# Patient Record
Sex: Female | Born: 1975 | Race: Black or African American | Hispanic: No | Marital: Single | Smoking: Former smoker
Health system: Southern US, Community
[De-identification: ages and names within clinical notes are randomized; demographics above are authoritative.]

## PROBLEM LIST (undated history)

## (undated) DIAGNOSIS — J302 Other seasonal allergic rhinitis: Secondary | ICD-10-CM

## (undated) DIAGNOSIS — L309 Dermatitis, unspecified: Secondary | ICD-10-CM

## (undated) DIAGNOSIS — E669 Obesity, unspecified: Secondary | ICD-10-CM

## (undated) DIAGNOSIS — D649 Anemia, unspecified: Secondary | ICD-10-CM

## (undated) DIAGNOSIS — J45909 Unspecified asthma, uncomplicated: Secondary | ICD-10-CM

## (undated) HISTORY — PX: NO PAST SURGERIES: SHX2092

## (undated) HISTORY — DX: Other seasonal allergic rhinitis: J30.2

## (undated) HISTORY — DX: Obesity, unspecified: E66.9

---

## 2018-03-30 ENCOUNTER — Emergency Department (HOSPITAL_BASED_OUTPATIENT_CLINIC_OR_DEPARTMENT_OTHER): Payer: Self-pay

## 2018-03-30 ENCOUNTER — Other Ambulatory Visit: Payer: Self-pay

## 2018-03-30 ENCOUNTER — Encounter (HOSPITAL_BASED_OUTPATIENT_CLINIC_OR_DEPARTMENT_OTHER): Payer: Self-pay | Admitting: Emergency Medicine

## 2018-03-30 ENCOUNTER — Emergency Department (HOSPITAL_BASED_OUTPATIENT_CLINIC_OR_DEPARTMENT_OTHER)
Admission: EM | Admit: 2018-03-30 | Discharge: 2018-03-31 | Disposition: A | Discharge status: Home / Self Care | Payer: Self-pay | Attending: Emergency Medicine | Admitting: Emergency Medicine

## 2018-03-30 DIAGNOSIS — Z79899 Other long term (current) drug therapy: Secondary | ICD-10-CM | POA: Insufficient documentation

## 2018-03-30 DIAGNOSIS — J9801 Acute bronchospasm: Secondary | ICD-10-CM | POA: Insufficient documentation

## 2018-03-30 HISTORY — DX: Unspecified asthma, uncomplicated: J45.909

## 2018-03-30 MED ORDER — ALBUTEROL SULFATE (2.5 MG/3ML) 0.083% IN NEBU
2.5000 mg | INHALATION_SOLUTION | RESPIRATORY_TRACT | Status: DC | PRN
  Administered 2018-03-31: 2.5 mg via RESPIRATORY_TRACT
  Filled 2018-03-30: qty 3

## 2018-03-30 NOTE — ED Triage Notes (Signed)
Pt c/o SOB since yesterday morning, hx of asthma pt out of her inhaler.

## 2018-03-31 MED ORDER — DEXAMETHASONE SODIUM PHOSPHATE 10 MG/ML IJ SOLN
10.0000 mg | Freq: Once | INTRAMUSCULAR | Status: AC
  Administered 2018-03-31: 10 mg via INTRAMUSCULAR
  Filled 2018-03-31: qty 1

## 2018-03-31 MED ORDER — ALBUTEROL SULFATE HFA 108 (90 BASE) MCG/ACT IN AERS
2.0000 | INHALATION_SPRAY | RESPIRATORY_TRACT | Status: DC | PRN
  Filled 2018-03-31: qty 6.7

## 2018-03-31 NOTE — ED Provider Notes (Signed)
[facility] Provider Note: Lowella Dell, MD, FACEP  CSN: [id number] MRN: [id number] ARRIVAL: 03/30/18 at 2336 ROOM: MH07/[facility]07   CHIEF COMPLAINT  Shortness of Breath   HISTORY OF PRESENT ILLNESS  03/31/18 1:17 AM Halaina Druck is a 43 y.o. female with a history of asthma.  She has had difficulty breathing since yesterday morning.  Symptoms are moderate.  She is out of her inhaler.  She was given a neb treatment here by respiratory therapy prior to my evaluation with significant improvement.  She denies fever or other symptoms of acute illness.   Past Medical History:  Diagnosis Date  . Asthma     History reviewed. No pertinent surgical history.  History reviewed. No pertinent family history.  Social History   Tobacco Use  . Smoking status: Never Smoker  . Smokeless tobacco: Never Used  Substance Use Topics  . Alcohol use: Never    Frequency: Never  . Drug use: Never    Prior to Admission medications   Medication Sig Start Date End Date Taking? Authorizing Provider  Albuterol Sulfate 108 (90 Base) MCG/ACT AEPB Inhale into the lungs.    [provider]    Allergies Claritin [loratadine] and Shellfish allergy   REVIEW OF SYSTEMS  Negative except as noted here or in the History of Present Illness.   PHYSICAL EXAMINATION  Initial Vital Signs Blood pressure 112/84, pulse 85, temperature 97.9 F (36.6 C), temperature source Oral, resp. rate 19, height 5\' 6"  (1.676 m), weight 87.1 kg, last menstrual period 02/22/2018, SpO2 100 %.  Examination General: Well-developed, well-nourished female in no acute distress; appearance consistent with age of record HENT: normocephalic; atraumatic Eyes: pupils equal, round and reactive to light; extraocular muscles intact Neck: supple Heart: regular rate and rhythm Lungs: clear to auscultation bilaterally Abdomen: soft; nondistended; nontender; bowel sounds present Extremities: No deformity; full range of  motion Neurologic: Awake, alert and oriented; motor function intact in all extremities and symmetric; no facial droop Skin: Warm and dry Psychiatric: Normal mood and affect   RESULTS  Summary of this visit's results, reviewed by myself:   EKG Interpretation  Date/Time:    Ventricular Rate:    PR Interval:    QRS Duration:   QT Interval:    QTC Calculation:   R Axis:     Text Interpretation:        Laboratory Studies: No results found for this or any previous visit (from the past 24 hour(s)). Imaging Studies: Dg Chest 2 View  Result Date: 03/31/2018 CLINICAL DATA:  Shortness of breath EXAM: CHEST - 2 VIEW COMPARISON:  None. FINDINGS: The heart size and mediastinal contours are within normal limits. Both lungs are clear. The visualized skeletal structures are unremarkable. IMPRESSION: Normal chest. Electronically Signed   By: Deatra Robinson M.D.   On: 03/31/2018 00:18    ED COURSE and MDM  Nursing notes and initial vitals signs, including pulse oximetry, reviewed.  Vitals:   03/30/18 2344 03/30/18 2348 03/31/18 0001 03/31/18 0009  BP:      Pulse:  85  85  Resp:      Temp:      TempSrc:      SpO2:  100% 99% 100%  Weight: 87.1 kg     Height: 5\' 6"  (1.676 m)       PROCEDURES    ED DIAGNOSES     ICD-10-CM   1. Acute bronchospasm J98.01        Shakina Choy, Jonny Ruiz, MD 03/31/18  0124  

## 2018-03-31 NOTE — Progress Notes (Signed)
RT provided patient with albuterol inhaler and spacer and educated patient on how to use it and how often. Patient verbalized that she has an inhaler at home and understands how to use.

## 2018-03-31 NOTE — ED Notes (Signed)
Pt reports relief after breathing Tx

## 2018-11-03 ENCOUNTER — Other Ambulatory Visit: Payer: Self-pay

## 2018-11-03 ENCOUNTER — Emergency Department (HOSPITAL_BASED_OUTPATIENT_CLINIC_OR_DEPARTMENT_OTHER)
Admission: EM | Admit: 2018-11-03 | Discharge: 2018-11-03 | Disposition: A | Discharge status: Home / Self Care | Payer: Medicaid Other | Attending: Emergency Medicine | Admitting: Emergency Medicine

## 2018-11-03 DIAGNOSIS — R21 Rash and other nonspecific skin eruption: Secondary | ICD-10-CM

## 2018-11-03 DIAGNOSIS — H1032 Unspecified acute conjunctivitis, left eye: Secondary | ICD-10-CM | POA: Insufficient documentation

## 2018-11-03 DIAGNOSIS — J45909 Unspecified asthma, uncomplicated: Secondary | ICD-10-CM | POA: Insufficient documentation

## 2018-11-03 DIAGNOSIS — L309 Dermatitis, unspecified: Secondary | ICD-10-CM | POA: Insufficient documentation

## 2018-11-03 MED ORDER — BACITRACIN-POLYMYXIN B 500-10000 UNIT/GM OP OINT: 1.0000 "application " | TOPICAL_OINTMENT | Freq: Two times a day (BID) | OPHTHALMIC | 0 refills | Status: DC

## 2018-11-03 MED ORDER — PREDNISONE 20 MG PO TABS: 40.0000 mg | ORAL_TABLET | Freq: Every day | ORAL | 0 refills | Status: AC

## 2018-11-03 MED ORDER — TETRACAINE HCL 0.5 % OP SOLN
1.0000 [drp] | Freq: Once | OPHTHALMIC | Status: AC
  Administered 2018-11-03: 11:00:00 1 [drp] via OPHTHALMIC
  Filled 2018-11-03: qty 4

## 2018-11-03 MED ORDER — TRIAMCINOLONE ACETONIDE 0.1 % EX CREA: 1.0000 "application " | TOPICAL_CREAM | Freq: Two times a day (BID) | CUTANEOUS | 0 refills | Status: DC

## 2018-11-03 MED ORDER — FLUORESCEIN SODIUM 1 MG OP STRP
1.0000 | ORAL_STRIP | Freq: Once | OPHTHALMIC | Status: AC
  Administered 2018-11-03: 1 via OPHTHALMIC
  Filled 2018-11-03: qty 1

## 2018-11-03 NOTE — ED Triage Notes (Signed)
Pt reports rash face arms and legs beginning one week ago.  Left eye irritation.  Pt started a new job this morning at [employer] and eye irritation worse today.  Has not taken any otc medications.

## 2018-11-03 NOTE — ED Provider Notes (Signed)
[facility] Emergency Department Provider Note MRN:  [id number]  Arrival date & time: 11/03/18     Chief Complaint   Rash   History of Present Illness   Darlene Logan is a 43 y.o. year-old female with a history of asthma, eczema presenting to the ED with chief complaint of rash.  Patient explains that she is has a history of eczema that is normally well controlled.  Has noticed some outbreak to her back on the left side of her face for the past 1 to 2 weeks.  For the past 2 days she is also noticed some redness to her left eye as well as left eye irritation.  She denies vision loss, no trauma to the eye, does not wear contact lenses.  Denies fever, no other symptoms.  Review of Systems  A complete 10 system review of systems was obtained and all systems are negative except as noted in the HPI and PMH.   Patient's Health History    Past Medical History:  Diagnosis Date  . Asthma     No past surgical history on file.  No family history on file.  Social History   Socioeconomic History  . Marital status: Single    Spouse name: Not on file  . Number of children: Not on file  . Years of education: Not on file  . Highest education level: Not on file  Occupational History  . Not on file  Social Needs  . Financial resource strain: Not on file  . Food insecurity    Worry: Not on file    Inability: Not on file  . Transportation needs    Medical: Not on file    Non-medical: Not on file  Tobacco Use  . Smoking status: Never Smoker  . Smokeless tobacco: Never Used  Substance and Sexual Activity  . Alcohol use: Never    Frequency: Never  . Drug use: Never  . Sexual activity: Not on file  Lifestyle  . Physical activity    Days per week: Not on file    Minutes per session: Not on file  . Stress: Not on file  Relationships  . Social Musicianconnections    Talks on phone: Not on file    Gets together: Not on file    Attends religious service: Not on file     Active member of club or organization: Not on file    Attends meetings of clubs or organizations: Not on file    Relationship status: Not on file  . Intimate partner violence    Fear of current or ex partner: Not on file    Emotionally abused: Not on file    Physically abused: Not on file    Forced sexual activity: Not on file  Other Topics Concern  . Not on file  Social History Narrative  . Not on file     Physical Exam  Vital Signs and Nursing Notes reviewed Vitals:   11/03/18 1014 11/03/18 1027  BP: 121/73   Pulse: 80   Resp: 16   Temp: 97.9 F (36.6 C)   SpO2: 99% 99%    CONSTITUTIONAL: Well-appearing, NAD NEURO:  Alert and oriented x 3, no focal deficits EYES:  eyes equal and reactive; conjunctival erythema to the left eye ENT/NECK:  no LAD, no JVD CARDIO: Regular rate, well-perfused, normal S1 and S2 PULM:  CTAB no wheezing or rhonchi GI/GU:  normal bowel sounds, non-distended, non-tender MSK/SPINE:  No gross deformities, no  edema SKIN: Maculopapular rash to the left cheek PSYCH:  Appropriate speech and behavior  Diagnostic and Interventional Summary    Labs Reviewed - No data to display  No orders to display    Medications  tetracaine (PONTOCAINE) 0.5 % ophthalmic solution 1 drop (1 drop Left Eye Given by Other 11/03/18 1039)  fluorescein ophthalmic strip 1 strip (1 strip Left Eye Given by Other 11/03/18 1039)     Procedures Critical Care  ED Course and Medical Decision Making  I have reviewed the triage vital signs and the nursing notes.  Pertinent labs & imaging results that were available during my care of the patient were reviewed by me and considered in my medical decision making (see below for details).  Bedside evaluation with Woods lamp, floor seen reveals no corneal abrasion or ulcer.  Favoring conjunctivitis, some serous drainage so we will cover for bacterial conjunctivitis with polymyxin.  Patient requesting steroid burst for her eczema flare,  which she has received by other doctors multiple times in the past.  Also requesting refill of her triamcinolone cream.  Patient advised to use caution using creams on the face as they can cause discoloration, with my advice being to follow-up with a dermatologist or PCP prior to doing so.  After the discussed management above, the patient was determined to be safe for discharge.  The patient was in agreement with this plan and all questions regarding their care were answered.  ED return precautions were discussed and the patient will return to the ED with any significant worsening of condition.  Barth Kirks. Sedonia Small, MD [place] [email or web address]  Final Clinical Impressions(s) / ED Diagnoses     ICD-10-CM   1. Eczema, unspecified type  L30.9   2. Rash  R21   3. Acute conjunctivitis of left eye, unspecified acute conjunctivitis type  H10.32     ED Discharge Orders         Ordered    bacitracin-polymyxin b (POLYSPORIN) ophthalmic ointment  Every 12 hours     11/03/18 1056    triamcinolone cream (KENALOG) 0.1 %  2 times daily     11/03/18 1056    predniSONE (DELTASONE) 20 MG tablet  Daily     11/03/18 1056             Maudie Flakes, MD 11/03/18 1100

## 2018-11-03 NOTE — Discharge Instructions (Signed)
You were evaluated in the Emergency Department and after careful evaluation, we did not find any emergent condition requiring admission or further testing in the hospital.  Your symptoms today seem to be due to eczema.  You also appear to have pinkeye.  Please use the polymyxin ointment for the eye as directed.  For your eczema, we will provide a short course of oral steroids as well as a steroid cream.  As discussed, creams applied to the face can cause discoloration of the skin so I would avoid doing this until you follow-up with a primary care doctor or dermatologist.  Please return to the Emergency Department if you experience any worsening of your condition.  We encourage you to follow up with a primary care provider.  Thank you for allowing [place] to be a part of your care.

## 2018-12-07 ENCOUNTER — Encounter (HOSPITAL_BASED_OUTPATIENT_CLINIC_OR_DEPARTMENT_OTHER): Payer: Self-pay | Admitting: *Deleted

## 2018-12-07 ENCOUNTER — Other Ambulatory Visit: Payer: Self-pay

## 2018-12-07 ENCOUNTER — Emergency Department (HOSPITAL_BASED_OUTPATIENT_CLINIC_OR_DEPARTMENT_OTHER)
Admission: EM | Admit: 2018-12-07 | Discharge: 2018-12-07 | Disposition: A | Discharge status: Home / Self Care | Payer: Medicaid Other | Attending: Emergency Medicine | Admitting: Emergency Medicine

## 2018-12-07 DIAGNOSIS — J45909 Unspecified asthma, uncomplicated: Secondary | ICD-10-CM | POA: Insufficient documentation

## 2018-12-07 DIAGNOSIS — L249 Irritant contact dermatitis, unspecified cause: Secondary | ICD-10-CM | POA: Insufficient documentation

## 2018-12-07 MED ORDER — PREDNISONE 10 MG (21) PO TBPK: ORAL_TABLET | ORAL | 0 refills | Status: DC

## 2018-12-07 MED ORDER — FLUTICASONE PROPIONATE 50 MCG/ACT NA SUSP: 2.0000 | Freq: Every day | NASAL | 2 refills | Status: DC

## 2018-12-07 MED ORDER — CETIRIZINE HCL 10 MG PO TABS: 10.0000 mg | ORAL_TABLET | Freq: Every day | ORAL | 2 refills | Status: DC

## 2018-12-07 MED ORDER — ARTIFICIAL TEARS OPHTHALMIC OINT: TOPICAL_OINTMENT | Freq: Every evening | OPHTHALMIC | 2 refills | Status: DC | PRN

## 2018-12-07 NOTE — ED Provider Notes (Signed)
[facility] EMERGENCY DEPARTMENT Provider Note   CSN: [id number] Arrival date & time: 12/07/18  1232     History   Chief Complaint Chief Complaint  Patient presents with  . Rash    HPI Darlene Logan is a 43 y.o. female.     HPI  Darlene Logan is a 43 y.o. female, with a history of asthma, presenting to the ED with skin irritation for about the last week.  She has had this issue before.  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  Since then, she has noticed an increase in her symptoms, especially over the last week. She has itching and puffiness to her eyelids bilaterally, itching and dry eyes in the morning, nasal congestion, rhinorrhea, and a rash that she describes as previously diagnosed eczema on her trunk.  Itchy eyes began simultaneously. She has tried applying previously prescribed triamcinolone 0.1% cream on the rash without improvement.  She has not tried any other therapies.  Denies fever/chills, N/V/D, chest pain, shortness of breath, abdominal pain, intraoral swelling, painful rash, throat pain/swelling, eye discharge, vision changes, or any other complaints.       Past Medical History:  Diagnosis Date  . Asthma     There are no active problems to display for this patient.   History reviewed. No pertinent surgical history.   OB History   No obstetric history on file.      Home Medications    Prior to Admission medications   Medication Sig Start Date End Date Taking? Authorizing Provider  artificial tears (LACRILUBE) OINT ophthalmic ointment Place into both eyes at bedtime as needed for dry eyes. 12/07/18   Joy, Shawn C, PA-C  bacitracin-polymyxin b (POLYSPORIN) ophthalmic ointment Place 1 application into the left eye every 12 (twelve) hours. apply to eye every 12 hours while awake 11/03/18   Sabas Sous, MD  cetirizine (ZYRTEC ALLERGY) 10 MG tablet Take 1 tablet (10 mg total) by mouth daily.  12/07/18   Joy, Shawn C, PA-C  fluticasone (FLONASE) 50 MCG/ACT nasal spray Place 2 sprays into both nostrils daily. 12/07/18   Joy, Shawn C, PA-C  predniSONE (STERAPRED UNI-PAK 21 TAB) 10 MG (21) TBPK tablet Take 6 tabs by mouth daily  for 2 days, then 5 tabs for 2 days, then 4 tabs for 2 days, then 3 tabs for 2 days, 2 tabs for 2 days, then 1 tab by mouth daily for 2 days 12/07/18   Joy, Shawn C, PA-C  triamcinolone cream (KENALOG) 0.1 % Apply 1 application topically 2 (two) times daily. 11/03/18   Sabas Sous, MD    Family History No family history on file.  Social History Social History   Tobacco Use  . Smoking status: Never Smoker  . Smokeless tobacco: Never Used  Substance Use Topics  . Alcohol use: Never    Frequency: Never  . Drug use: Never     Allergies   Claritin [loratadine], Other, and Shellfish allergy   Review of Systems Review of Systems  Constitutional: Negative for chills, diaphoresis and fever.  HENT: Positive for congestion, rhinorrhea and sneezing. Negative for ear discharge, ear pain, sinus pressure, sinus pain, sore throat, trouble swallowing and voice change.   Eyes: Positive for itching. Negative for photophobia, discharge and visual disturbance.  Respiratory: Negative for cough and shortness of breath.   Cardiovascular: Negative for chest pain.  Gastrointestinal: Negative for abdominal pain, diarrhea, nausea and vomiting.  Musculoskeletal: Negative for back pain, myalgias and neck pain.  Neurological: Negative for dizziness, weakness, light-headedness and headaches.  All other systems reviewed and are negative.    Physical Exam Updated Vital Signs BP (!) 139/56   Pulse 88   Temp 98.7 F (37.1 C)   Resp 18   Ht 5\' 6"  (1.676 m)   Wt 86.2 kg   LMP 11/22/2018   SpO2 100%   BMI 30.67 kg/m   Physical Exam Vitals signs and nursing note reviewed.  Constitutional:      General: She is not in acute distress.    Appearance: She is well-developed.  She is not diaphoretic.  HENT:     Head: Normocephalic and atraumatic.     Mouth/Throat:     Mouth: Mucous membranes are moist.     Pharynx: Oropharynx is clear.  Eyes:     Extraocular Movements: Extraocular movements intact.     Conjunctiva/sclera: Conjunctivae normal.     Pupils: Pupils are equal, round, and reactive to light.     Comments: Some erythema, puffiness, mild appearing irritation to the bilateral upper eyelids especially.  Tearing in the eyes, but no purulence or other exudate.  Conjunctiva appear normal bilaterally.  No pain with EOMs.  Neck:     Musculoskeletal: Neck supple.  Cardiovascular:     Rate and Rhythm: Normal rate and regular rhythm.     Pulses: Normal pulses.  Pulmonary:     Effort: Pulmonary effort is normal. No respiratory distress.     Breath sounds: Normal breath sounds.  Abdominal:     Palpations: Abdomen is soft.     Tenderness: There is no abdominal tenderness. There is no guarding.  Musculoskeletal:     Right lower leg: No edema.     Left lower leg: No edema.  Lymphadenopathy:     Cervical: No cervical adenopathy.  Skin:    General: Skin is warm and dry.     Comments: Eczema appearing rash to the to the right flank as well as the back.  Associated excoriations.  No pustules or vesicles noted.  No tenderness.  Neurological:     Mental Status: She is alert.  Psychiatric:        Mood and Affect: Mood and affect normal.        Speech: Speech normal.        Behavior: Behavior normal.                ED Treatments / Results  Labs (all labs ordered are listed, but only abnormal results are displayed) Labs Reviewed - No data to display  EKG None  Radiology No results found.  Procedures Procedures (including critical care time)  Medications Ordered in ED Medications - No data to display   Initial Impression / Assessment and Plan / ED Course  I have reviewed the triage vital signs and the nursing notes.  Pertinent labs &  imaging results that were available during my care of the patient were reviewed by me and considered in my medical decision making (see chart for details).        Patient presents with irritation to her eyelids as well as some skin irritation and nasal congestion.  I suspect the symptoms may be related to the overall environment of working in a warehouse, including dust.  We discussed PPE precautions.  PCP follow-up for further maintenance. The patient was given instructions for home care as well as return precautions. Patient voices understanding of these  instructions, accepts the plan, and is comfortable with discharge.  Final Clinical Impressions(s) / ED Diagnoses   Final diagnoses:  Irritant dermatitis    ED Discharge Orders         Ordered    fluticasone (FLONASE) 50 MCG/ACT nasal spray  Daily     12/07/18 1340    predniSONE (STERAPRED UNI-PAK 21 TAB) 10 MG (21) TBPK tablet     12/07/18 1340    artificial tears (LACRILUBE) OINT ophthalmic ointment  At bedtime PRN     12/07/18 1340    cetirizine (ZYRTEC ALLERGY) 10 MG tablet  Daily     12/07/18 1341           Anselm PancoastJoy, Shawn C, PA-C 12/07/18 1420    Gwyneth SproutPlunkett, [place], MD 12/07/18 1518

## 2018-12-07 NOTE — ED Triage Notes (Signed)
C/o rash x 1 week

## 2018-12-07 NOTE — Discharge Instructions (Signed)
°  Prednisone: Take the prednisone, as prescribed, until finished. If you are a diabetic, please know prednisone can raise your blood sugar temporarily. Cetirizine: This medication is generic for Zyrtec.  For best effect, take this medication daily.  It may be taken in the morning, however, if it causes drowsiness, begin taking it before bed.  May use over-the-counter rewetting drops for your eyes to avoid irritation.  At work, recommend using hand protection, dust mask, and eye protection to diminish symptoms.  Recommend follow-up with primary care provider for any further management of this issue.

## 2019-02-17 ENCOUNTER — Other Ambulatory Visit: Payer: Self-pay

## 2019-02-17 ENCOUNTER — Encounter (HOSPITAL_COMMUNITY): Payer: Self-pay | Admitting: Emergency Medicine

## 2019-02-17 ENCOUNTER — Emergency Department (HOSPITAL_COMMUNITY)
Admission: EM | Admit: 2019-02-17 | Discharge: 2019-02-17 | Disposition: A | Discharge status: Home / Self Care | Payer: Medicaid Other | Attending: Emergency Medicine | Admitting: Emergency Medicine

## 2019-02-17 DIAGNOSIS — L309 Dermatitis, unspecified: Secondary | ICD-10-CM

## 2019-02-17 DIAGNOSIS — J45909 Unspecified asthma, uncomplicated: Secondary | ICD-10-CM | POA: Insufficient documentation

## 2019-02-17 DIAGNOSIS — Z79899 Other long term (current) drug therapy: Secondary | ICD-10-CM | POA: Insufficient documentation

## 2019-02-17 MED ORDER — CETIRIZINE HCL 10 MG PO TABS: 10.0000 mg | ORAL_TABLET | Freq: Every day | ORAL | 2 refills | Status: DC

## 2019-02-17 MED ORDER — TRIAMCINOLONE ACETONIDE 0.1 % EX CREA: 1.0000 "application " | TOPICAL_CREAM | Freq: Two times a day (BID) | CUTANEOUS | 0 refills | Status: DC

## 2019-02-17 MED ORDER — PREDNISONE 10 MG (21) PO TBPK: ORAL_TABLET | ORAL | 0 refills | Status: DC

## 2019-02-17 NOTE — ED Triage Notes (Signed)
Pt has rash to face, chest, stomach and arms for a few days. Reports sensitive skin.

## 2019-02-17 NOTE — ED Provider Notes (Signed)
[facility] EMERGENCY DEPARTMENT Provider Note   CSN: [id number] Arrival date & time: 02/17/19  1648     History   Chief Complaint Chief Complaint  Patient presents with  . Rash    HPI Darlene Logan is a 43 y.o. female.     The history is provided by the patient. No language interpreter was used.  Rash Associated symptoms: no fever      43 year old female with history of asthma presenting for evaluation of a rash.  Patient report she has history of eczema.  For the past week she developed progressive worsening itchy rash throughout her body.  She first noticed this around her neck area when she started to use a new wig.  The rash has since spread to her abdomen, and her bilateral upper arm.  It is itchy uncomfortable moderate in severity.  It felt similar to prior eczema flareup.  She tries using her home steroid cream that was previously prescribed without adequate relief.  She does not complain of any fever, headache, throat swelling, tongue swelling, abdominal cramping, trouble breathing or joint pain.  Past Medical History:  Diagnosis Date  . Asthma     There are no active problems to display for this patient.   History reviewed. No pertinent surgical history.   OB History   No obstetric history on file.      Home Medications    Prior to Admission medications   Medication Sig Start Date End Date Taking? Authorizing Provider  artificial tears (LACRILUBE) OINT ophthalmic ointment Place into both eyes at bedtime as needed for dry eyes. 12/07/18   Joy, Shawn C, PA-C  bacitracin-polymyxin b (POLYSPORIN) ophthalmic ointment Place 1 application into the left eye every 12 (twelve) hours. apply to eye every 12 hours while awake 11/03/18   Sabas Sous, MD  cetirizine (ZYRTEC ALLERGY) 10 MG tablet Take 1 tablet (10 mg total) by mouth daily. 12/07/18   Joy, Shawn C, PA-C  fluticasone (FLONASE) 50 MCG/ACT nasal spray Place 2 sprays into both nostrils daily.  12/07/18   Joy, Shawn C, PA-C  predniSONE (STERAPRED UNI-PAK 21 TAB) 10 MG (21) TBPK tablet Take 6 tabs by mouth daily  for 2 days, then 5 tabs for 2 days, then 4 tabs for 2 days, then 3 tabs for 2 days, 2 tabs for 2 days, then 1 tab by mouth daily for 2 days 12/07/18   Joy, Shawn C, PA-C  triamcinolone cream (KENALOG) 0.1 % Apply 1 application topically 2 (two) times daily. 11/03/18   Sabas Sous, MD    Family History No family history on file.  Social History Social History   Tobacco Use  . Smoking status: Never Smoker  . Smokeless tobacco: Never Used  Substance Use Topics  . Alcohol use: Never    Frequency: Never  . Drug use: Never     Allergies   Claritin [loratadine], Other, and Shellfish allergy   Review of Systems Review of Systems  Constitutional: Negative for fever.  Skin: Positive for rash.     Physical Exam Updated Vital Signs BP (!) 133/109   Pulse 94   Temp 98.5 F (36.9 C) (Oral)   Resp 20   Ht 5' 6.5" (1.689 m)   Wt 88.5 kg   SpO2 100%   BMI 31.00 kg/m   Physical Exam Vitals signs and nursing note reviewed.  Constitutional:      General: She is not in acute distress.    Appearance:  She is well-developed.  HENT:     Head: Atraumatic.  Eyes:     Conjunctiva/sclera: Conjunctivae normal.  Neck:     Musculoskeletal: Neck supple.  Cardiovascular:     Rate and Rhythm: Normal rate and regular rhythm.  Pulmonary:     Breath sounds: No wheezing, rhonchi or rales.  Abdominal:     Palpations: Abdomen is soft.  Skin:    Findings: Rash (Raise scaly hyperpigmented rash noted to upper chest, around neck, bilateral forearm, and mid abdomen without any significant warmth or erythema.) present.  Neurological:     Mental Status: She is alert.      ED Treatments / Results  Labs (all labs ordered are listed, but only abnormal results are displayed) Labs Reviewed - No data to display  EKG None  Radiology No results found.  Procedures Procedures  (including critical care time)  Medications Ordered in ED Medications - No data to display   Initial Impression / Assessment and Plan / ED Course  I have reviewed the triage vital signs and the nursing notes.  Pertinent labs & imaging results that were available during my care of the patient were reviewed by me and considered in my medical decision making (see chart for details).        BP (!) 133/109   Pulse 94   Temp 98.5 F (36.9 C) (Oral)   Resp 20   Ht 5' 6.5" (1.689 m)   Wt 88.5 kg   SpO2 100%   BMI 31.00 kg/m    Final Clinical Impressions(s) / ED Diagnoses   Final diagnoses:  Eczema, unspecified type    ED Discharge Orders    None     5:56 PM Pt here with rash to neck, upper chest, bilateral upper arms and mid abdomen suggestive of eczema flare.  Doubt infectious etiology.  Will provide treatment.  Dermatology referral given.    Domenic Moras, PA-C 02/17/19 1803    Drenda Freeze, MD 02/17/19 [phone]

## 2019-08-10 ENCOUNTER — Other Ambulatory Visit: Payer: Self-pay

## 2019-08-10 ENCOUNTER — Ambulatory Visit (HOSPITAL_COMMUNITY)
Admission: EM | Admit: 2019-08-10 | Discharge: 2019-08-10 | Disposition: A | Discharge status: Home / Self Care | Payer: 59

## 2019-08-10 ENCOUNTER — Encounter (HOSPITAL_COMMUNITY): Payer: Self-pay

## 2019-08-10 DIAGNOSIS — L309 Dermatitis, unspecified: Secondary | ICD-10-CM | POA: Diagnosis not present

## 2019-08-10 HISTORY — DX: Dermatitis, unspecified: L30.9

## 2019-08-10 HISTORY — DX: Anemia, unspecified: D64.9

## 2019-08-10 MED ORDER — PREDNISONE 10 MG (21) PO TBPK: ORAL_TABLET | ORAL | 0 refills | Status: DC

## 2019-08-10 MED ORDER — TRIAMCINOLONE ACETONIDE 0.1 % EX CREA: 1.0000 "application " | TOPICAL_CREAM | Freq: Two times a day (BID) | CUTANEOUS | 0 refills | Status: DC

## 2019-08-10 MED ORDER — CETIRIZINE HCL 10 MG PO TABS: 10.0000 mg | ORAL_TABLET | Freq: Every day | ORAL | 2 refills | Status: AC

## 2019-08-10 NOTE — ED Provider Notes (Signed)
[place]    CSN: [id number] Arrival date & time: 08/10/19  1024      History   Chief Complaint Chief Complaint  Patient presents with  . Eczema    HPI Darlene Logan is a 44 y.o. female.   Patient is a 44 year old female with past history of anemia, asthma, eczema.  She presents today with approximately 1-1/2 weeks of worsening eczema flare.  Started worsening yesterday after eating [employer].  Significant itching all over.  The rash is generalized throughout body but mostly to face and surrounding eyelids.  Denies any fever, joint pain. Denies any recent changes in lotions, detergents, foods or other possible irritants. No recent travel. Nobody else at home has the rash. Patient has been outside but denies any contact with plants or insects. No new medications.    ROS per HPI      Past Medical History:  Diagnosis Date  . Anemia   . Asthma   . Eczema     There are no problems to display for this patient.   History reviewed. No pertinent surgical history.  OB History   No obstetric history on file.      Home Medications    Prior to Admission medications   Medication Sig Start Date End Date Taking? Authorizing Provider  albuterol (VENTOLIN HFA) 108 (90 Base) MCG/ACT inhaler Inhale into the lungs every 6 (six) hours as needed for wheezing or shortness of breath.   Yes [provider]  cetirizine (ZYRTEC ALLERGY) 10 MG tablet Take 1 tablet (10 mg total) by mouth daily. 08/10/19   Isobelle Tuckett, Tressia Miners A, NP  fluticasone (FLONASE) 50 MCG/ACT nasal spray Place 2 sprays into both nostrils daily. 12/07/18   Joy, Shawn C, PA-C  predniSONE (STERAPRED UNI-PAK 21 TAB) 10 MG (21) TBPK tablet Take 6 tabs by mouth daily  for 2 days, then 5 tabs for 2 days, then 4 tabs for 2 days, then 3 tabs for 2 days, 2 tabs for 2 days, then 1 tab by mouth daily for 2 days 08/10/19   Loura Halt A, NP  triamcinolone cream (KENALOG) 0.1 % Apply 1 application topically 2 (two) times  daily. 08/10/19   Orvan July, NP    Family History History reviewed. No pertinent family history.  Social History Social History   Tobacco Use  . Smoking status: Never Smoker  . Smokeless tobacco: Never Used  Substance Use Topics  . Alcohol use: Never  . Drug use: Never     Allergies   Claritin [loratadine], Other, and Shellfish allergy   Review of Systems Review of Systems   Physical Exam Triage Vital Signs ED Triage Vitals  Enc Vitals Group     BP 08/10/19 1051 130/65     Pulse Rate 08/10/19 1051 88     Resp 08/10/19 1051 18     Temp 08/10/19 1051 98 F (36.7 C)     Temp Source 08/10/19 1051 Oral     SpO2 08/10/19 1051 100 %     Weight 08/10/19 1054 197 lb (89.4 kg)     Height 08/10/19 1054 5\' 6"  (1.676 m)     Head Circumference --      Peak Flow --      Pain Score 08/10/19 1054 0     Pain Loc --      Pain Edu? --      Excl. in [place]? --    No data found.  Updated Vital Signs BP 130/65  Pulse 88   Temp 98 F (36.7 C) (Oral)   Resp 18   Ht 5\' 6"  (1.676 m)   Wt 197 lb (89.4 kg)   SpO2 100%   BMI 31.80 kg/m   Visual Acuity Right Eye Distance:   Left Eye Distance:   Bilateral Distance:    Right Eye Near:   Left Eye Near:    Bilateral Near:     Physical Exam Vitals and nursing note reviewed.  Constitutional:      General: She is not in acute distress.    Appearance: Normal appearance. She is not ill-appearing, toxic-appearing or diaphoretic.  HENT:     Head: Normocephalic.     Nose: Nose normal.     Mouth/Throat:     Pharynx: Oropharynx is clear.  Eyes:     Conjunctiva/sclera: Conjunctivae normal.  Pulmonary:     Effort: Pulmonary effort is normal.  Abdominal:     Palpations: Abdomen is soft.     Tenderness: There is no abdominal tenderness.  Musculoskeletal:        General: Normal range of motion.     Cervical back: Normal range of motion.  Skin:    General: Skin is warm and dry.     Findings: No rash.     Comments: Generalized  scaly erythematous rash consistent with eczema located throughout body but mostly to facial area and surrounding eyes  Neurological:     Mental Status: She is alert.  Psychiatric:        Mood and Affect: Mood normal.      UC Treatments / Results  Labs (all labs ordered are listed, but only abnormal results are displayed) Labs Reviewed - No data to display  EKG   Radiology No results found.  Procedures Procedures (including critical care time)  Medications Ordered in UC Medications - No data to display  Initial Impression / Assessment and Plan / UC Course  I have reviewed the triage vital signs and the nursing notes.  Pertinent labs & imaging results that were available during my care of the patient were reviewed by me and considered in my medical decision making (see chart for details).     Eczema Treating with prednisone taper and triamcinolone cream Zyrtec for itching Follow up as needed for continued or worsening symptoms  Final Clinical Impressions(s) / UC Diagnoses   Final diagnoses:  Eczema, unspecified type     Discharge Instructions     Treating you for eczema flare.  Take medication as prescribed. Follow up as needed for continued or worsening symptoms     ED Prescriptions    Medication Sig Dispense Auth. Provider   predniSONE (STERAPRED UNI-PAK 21 TAB) 10 MG (21) TBPK tablet Take 6 tabs by mouth daily  for 2 days, then 5 tabs for 2 days, then 4 tabs for 2 days, then 3 tabs for 2 days, 2 tabs for 2 days, then 1 tab by mouth daily for 2 days 42 tablet Aubriana Ravelo A, NP   triamcinolone cream (KENALOG) 0.1 % Apply 1 application topically 2 (two) times daily. 30 g Fahed Morten A, NP   cetirizine (ZYRTEC ALLERGY) 10 MG tablet Take 1 tablet (10 mg total) by mouth daily. 30 tablet A, NP     PDMP not reviewed this encounter.   Dahlia Byes, NP 08/10/19 1123

## 2019-08-10 NOTE — ED Triage Notes (Signed)
Pt c/o eczemax1.5 wks. Pt states she ate curry chicken that she usually doesn't eat and it flared her eczema up worse yesterday. Pt states she is itching very bad.

## 2019-08-10 NOTE — Discharge Instructions (Addendum)
Treating you for eczema flare.  Take medication as prescribed. Follow up as needed for continued or worsening symptoms  

## 2019-09-06 ENCOUNTER — Ambulatory Visit (HOSPITAL_COMMUNITY)
Admission: EM | Admit: 2019-09-06 | Discharge: 2019-09-06 | Disposition: A | Discharge status: Home / Self Care | Payer: 59 | Attending: Urgent Care | Admitting: Urgent Care

## 2019-09-06 ENCOUNTER — Other Ambulatory Visit: Payer: Self-pay

## 2019-09-06 ENCOUNTER — Encounter (HOSPITAL_COMMUNITY): Payer: Self-pay | Admitting: Emergency Medicine

## 2019-09-06 DIAGNOSIS — M79605 Pain in left leg: Secondary | ICD-10-CM | POA: Diagnosis not present

## 2019-09-06 DIAGNOSIS — M545 Low back pain, unspecified: Secondary | ICD-10-CM

## 2019-09-06 DIAGNOSIS — R202 Paresthesia of skin: Secondary | ICD-10-CM

## 2019-09-06 MED ORDER — TIZANIDINE HCL 4 MG PO TABS: 4.0000 mg | ORAL_TABLET | Freq: Every day | ORAL | 0 refills | Status: DC

## 2019-09-06 MED ORDER — PREDNISONE 20 MG PO TABS: ORAL_TABLET | ORAL | 0 refills | Status: DC

## 2019-09-06 NOTE — ED Triage Notes (Signed)
Pt sts left leg pain that feels like tingling on entire leg intermittently but worse when waking from sleep

## 2019-09-06 NOTE — ED Provider Notes (Signed)
[facility]   MRN: [id number] DOB: January 28, 1976  Subjective:   Darlene Logan is a 44 y.o. female presenting for 39-month history of progressively worsening left leg pain, intermittent tingling and intermittent low back pain.  Symptoms started without any known inciting factors, denies falls, trauma, history of back issues.  Denies weakness.  Patient is currently working at [employer], does a lot of lifting and bending.  Symptoms have become more constant and worse after work.  No current facility-administered medications for this encounter.  Current Outpatient Medications:  .  albuterol (VENTOLIN HFA) 108 (90 Base) MCG/ACT inhaler, Inhale into the lungs every 6 (six) hours as needed for wheezing or shortness of breath., Disp: , Rfl:  .  cetirizine (ZYRTEC ALLERGY) 10 MG tablet, Take 1 tablet (10 mg total) by mouth daily., Disp: 30 tablet, Rfl: 2 .  fluticasone (FLONASE) 50 MCG/ACT nasal spray, Place 2 sprays into both nostrils daily., Disp: 16 g, Rfl: 2 .  predniSONE (STERAPRED UNI-PAK 21 TAB) 10 MG (21) TBPK tablet, Take 6 tabs by mouth daily  for 2 days, then 5 tabs for 2 days, then 4 tabs for 2 days, then 3 tabs for 2 days, 2 tabs for 2 days, then 1 tab by mouth daily for 2 days (Patient not taking: Reported on 09/06/2019), Disp: 42 tablet, Rfl: 0 .  triamcinolone cream (KENALOG) 0.1 %, Apply 1 application topically 2 (two) times daily., Disp: 30 g, Rfl: 0   Allergies  Allergen Reactions  . Claritin [Loratadine]   . Other     Flu vaccine  . Shellfish Allergy     Past Medical History:  Diagnosis Date  . Anemia   . Asthma   . Eczema      History reviewed. No pertinent surgical history.  History reviewed. No pertinent family history.  Social History   Tobacco Use  . Smoking status: Never Smoker  . Smokeless tobacco: Never Used  Substance Use Topics  . Alcohol use: Never  . Drug use: Never    ROS   Objective:   Vitals: BP 132/90 (BP Location: Right Arm)   Pulse  80   Temp 98.2 F (36.8 C) (Oral)   Resp 18   SpO2 99%   Physical Exam Constitutional:      General: She is not in acute distress.    Appearance: Normal appearance. She is well-developed. She is not ill-appearing, toxic-appearing or diaphoretic.  HENT:     Head: Normocephalic and atraumatic.     Nose: Nose normal.     Mouth/Throat:     Mouth: Mucous membranes are moist.     Pharynx: Oropharynx is clear.  Eyes:     General: No scleral icterus.       Right eye: No discharge.        Left eye: No discharge.     Extraocular Movements: Extraocular movements intact.     Pupils: Pupils are equal, round, and reactive to light.  Cardiovascular:     Rate and Rhythm: Normal rate.  Pulmonary:     Effort: Pulmonary effort is normal.  Musculoskeletal:     Lumbar back: No swelling, edema, deformity, signs of trauma, lacerations, spasms, tenderness or bony tenderness. Normal range of motion. Positive left straight leg raise test. Negative right straight leg raise test. No scoliosis.  Skin:    General: Skin is warm and dry.  Neurological:     General: No focal deficit present.     Mental Status: She is alert and  oriented to person, place, and time.     Motor: No weakness.     Coordination: Coordination normal.     Gait: Gait normal.     Deep Tendon Reflexes: Reflexes normal.  Psychiatric:        Mood and Affect: Mood normal.        Behavior: Behavior normal.        Thought Content: Thought content normal.        Judgment: Judgment normal.      Assessment and Plan :   PDMP not reviewed this encounter.  1. Left leg pain   2. Acute bilateral low back pain without sciatica   3. Tingling     Suspect nerve irritation, sciatica.  Recommended oral steroid course, muscle relaxant and rest.  Patient needs to follow-up with neurosurgery for consideration of an MRI and further work-up.  Note for work provided to the patient.  Counseled on back care. Counseled patient on potential for adverse  effects with medications prescribed/recommended today, ER and return-to-clinic precautions discussed, patient verbalized understanding.    Jaynee Eagles, PA-C 09/06/19 1201

## 2019-09-23 ENCOUNTER — Encounter (HOSPITAL_COMMUNITY): Payer: Self-pay

## 2019-09-23 ENCOUNTER — Ambulatory Visit (HOSPITAL_COMMUNITY)
Admission: EM | Admit: 2019-09-23 | Discharge: 2019-09-23 | Disposition: A | Discharge status: Home / Self Care | Payer: 59 | Attending: Physician Assistant | Admitting: Physician Assistant

## 2019-09-23 ENCOUNTER — Other Ambulatory Visit: Payer: Self-pay

## 2019-09-23 DIAGNOSIS — L309 Dermatitis, unspecified: Secondary | ICD-10-CM

## 2019-09-23 MED ORDER — PREDNISONE 10 MG (21) PO TBPK: ORAL_TABLET | Freq: Every day | ORAL | 0 refills | Status: DC

## 2019-09-23 MED ORDER — TRIAMCINOLONE ACETONIDE 0.1 % EX CREA: TOPICAL_CREAM | Freq: Two times a day (BID) | CUTANEOUS | 0 refills | Status: DC

## 2019-09-23 NOTE — ED Provider Notes (Signed)
[facility]    CSN: [id number] Arrival date & time: 09/23/19  1122      History   Chief Complaint Chief Complaint  Patient presents with  . Rash    HPI Darlene Logan is a 44 y.o. female.   Presents with an acute flare of chronic eczema. Severe pruritis. She is only on topicals which are not helping. She usually has a pred pack to help her, but reports that she needs to see a dermatologist. No additional complaints.      Past Medical History:  Diagnosis Date  . Anemia   . Asthma   . Eczema     There are no problems to display for this patient.   History reviewed. No pertinent surgical history.  OB History   No obstetric history on file.      Home Medications    Prior to Admission medications   Medication Sig Start Date End Date Taking? Authorizing Provider  albuterol (VENTOLIN HFA) 108 (90 Base) MCG/ACT inhaler Inhale into the lungs every 6 (six) hours as needed for wheezing or shortness of breath.    [provider]  cetirizine (ZYRTEC ALLERGY) 10 MG tablet Take 1 tablet (10 mg total) by mouth daily. 08/10/19   Bast, Gloris [place] A, NP  fluticasone (FLONASE) 50 MCG/ACT nasal spray Place 2 sprays into both nostrils daily. 12/07/18   Joy, Shawn C, PA-C  predniSONE (DELTASONE) 20 MG tablet Take 2 tablets daily with breakfast. 09/06/19   Wallis Bamberg, PA-C  tiZANidine (ZANAFLEX) 4 MG tablet Take 1 tablet (4 mg total) by mouth at bedtime. 09/06/19   Wallis Bamberg, PA-C  triamcinolone cream (KENALOG) 0.1 % Apply 1 application topically 2 (two) times daily. 08/10/19   Janace Aris, NP    Family History History reviewed. No pertinent family history.  Social History Social History   Tobacco Use  . Smoking status: Never Smoker  . Smokeless tobacco: Never Used  Substance Use Topics  . Alcohol use: Never  . Drug use: Never     Allergies   Claritin [loratadine], Other, and Shellfish allergy   Review of Systems Review of Systems  Eyes: Positive for  itching.  Skin: Positive for rash.  All other systems reviewed and are negative.    Physical Exam Triage Vital Signs ED Triage Vitals  Enc Vitals Group     BP 09/23/19 1220 118/77     Pulse Rate 09/23/19 1220 (!) 105     Resp 09/23/19 1220 18     Temp 09/23/19 1220 98 F (36.7 C)     Temp Source 09/23/19 1220 Oral     SpO2 09/23/19 1220 100 %     Weight --      Height --      Head Circumference --      Peak Flow --      Pain Score 09/23/19 1221 4     Pain Loc --      Pain Edu? --      Excl. in [place]? --    No data found.  Updated Vital Signs BP 118/77 (BP Location: Right Arm)   Pulse (!) 105   Temp 98 F (36.7 C) (Oral)   Resp 18   LMP 08/18/2019   SpO2 100%   Visual Acuity Right Eye Distance:   Left Eye Distance:   Bilateral Distance:    Right Eye Near:   Left Eye Near:    Bilateral Near:     Physical Exam Vitals  and nursing note reviewed.  Constitutional:      Appearance: Normal appearance.  HENT:     Head: Normocephalic and atraumatic.  Pulmonary:     Effort: Pulmonary effort is normal.  Skin:    General: Skin is warm and dry.     Findings: Rash present.     Comments: Thick scaling inferior to both eyes, periorbital region, left neck and chest. Both arms  Neurological:     General: No focal deficit present.     Mental Status: She is alert.  Psychiatric:        Mood and Affect: Mood normal.      UC Treatments / Results  Labs (all labs ordered are listed, but only abnormal results are displayed) Labs Reviewed - No data to display  EKG   Radiology No results found.  Procedures Procedures (including critical care time)  Medications Ordered in UC Medications - No data to display  Initial Impression / Assessment and Plan / UC Course  I have reviewed the triage vital signs and the nursing notes.  Pertinent labs & imaging results that were available during my care of the patient were reviewed by me and considered in my medical decision  making (see chart for details).     Treat for acute flare. Suggestion given for chronic management with dermatologist.  Final Clinical Impressions(s) / UC Diagnoses   Final diagnoses:  None   Discharge Instructions   None    ED Prescriptions    None     PDMP not reviewed this encounter.   Bjorn Pippin, [place] 09/23/19 1241

## 2019-09-23 NOTE — ED Triage Notes (Signed)
Pt present rash that has spread on certain area of the body. Pt state the rash is itching.

## 2019-09-23 NOTE — Discharge Instructions (Addendum)
Treat acute flare, but please make appt with Derm for longterm management.

## 2019-10-30 ENCOUNTER — Emergency Department (HOSPITAL_COMMUNITY)
Admission: EM | Admit: 2019-10-30 | Discharge: 2019-10-30 | Disposition: A | Discharge status: Home / Self Care | Payer: 59 | Attending: Emergency Medicine | Admitting: Emergency Medicine

## 2019-10-30 ENCOUNTER — Encounter (HOSPITAL_COMMUNITY): Payer: Self-pay | Admitting: Emergency Medicine

## 2019-10-30 ENCOUNTER — Ambulatory Visit (HOSPITAL_COMMUNITY)
Admission: EM | Admit: 2019-10-30 | Discharge: 2019-10-30 | Disposition: A | Discharge status: Home / Self Care | Payer: 59 | Attending: Family Medicine | Admitting: Family Medicine

## 2019-10-30 ENCOUNTER — Other Ambulatory Visit: Payer: Self-pay

## 2019-10-30 ENCOUNTER — Encounter (HOSPITAL_COMMUNITY): Payer: Self-pay | Admitting: *Deleted

## 2019-10-30 DIAGNOSIS — H5789 Other specified disorders of eye and adnexa: Secondary | ICD-10-CM | POA: Insufficient documentation

## 2019-10-30 DIAGNOSIS — Z5321 Procedure and treatment not carried out due to patient leaving prior to being seen by health care provider: Secondary | ICD-10-CM | POA: Insufficient documentation

## 2019-10-30 DIAGNOSIS — R21 Rash and other nonspecific skin eruption: Secondary | ICD-10-CM | POA: Insufficient documentation

## 2019-10-30 NOTE — ED Triage Notes (Signed)
The pt has an itchinf rash around both her eyes   For 2 weeks   lmp june

## 2019-10-30 NOTE — ED Triage Notes (Signed)
Pt here for rash around both eyes with watering and draining x 2 weeks; pt sts hx of eczema

## 2019-10-30 NOTE — ED Provider Notes (Signed)
  [facility]   [id number] 10/30/19 Arrival Time: 1053  Patient LWBS after triage.     Mardella Layman, MD 10/30/19 1246

## 2019-10-30 NOTE — ED Notes (Signed)
Pt decided to leave 

## 2019-10-31 ENCOUNTER — Other Ambulatory Visit: Payer: Self-pay

## 2019-10-31 ENCOUNTER — Ambulatory Visit (HOSPITAL_COMMUNITY)
Admission: EM | Admit: 2019-10-31 | Discharge: 2019-10-31 | Disposition: A | Discharge status: Home / Self Care | Payer: 59 | Attending: Family Medicine | Admitting: Family Medicine

## 2019-10-31 ENCOUNTER — Encounter (HOSPITAL_COMMUNITY): Payer: Self-pay

## 2019-10-31 DIAGNOSIS — H01134 Eczematous dermatitis of left upper eyelid: Secondary | ICD-10-CM

## 2019-10-31 DIAGNOSIS — H01135 Eczematous dermatitis of left lower eyelid: Secondary | ICD-10-CM

## 2019-10-31 DIAGNOSIS — H01132 Eczematous dermatitis of right lower eyelid: Secondary | ICD-10-CM

## 2019-10-31 DIAGNOSIS — H01131 Eczematous dermatitis of right upper eyelid: Secondary | ICD-10-CM

## 2019-10-31 MED ORDER — PREDNISONE 10 MG (21) PO TBPK: ORAL_TABLET | Freq: Every day | ORAL | 0 refills | Status: DC

## 2019-10-31 NOTE — Discharge Instructions (Signed)
Take prednisone as directed Take all of day 1 today Cool compresses to eyes For 2 days Call or return for problems

## 2019-10-31 NOTE — ED Triage Notes (Signed)
Pt presents with bilateral eye irritation with both eyes watering, draining & skin cracking around them.  Pt  Is also here with chronic eczema issue.

## 2019-10-31 NOTE — ED Provider Notes (Signed)
[facility]    CSN: [id number] Arrival date & time: 10/31/19  2620      History   Chief Complaint Chief Complaint  Patient presents with  . Eye Problem  . Eczema    HPI Darlene Logan is a 44 y.o. female.   HPI   Patient has lifelong eczema.  She has struggled with a variety of creams and lotions.   Since the weekend she has had increased swelling, drainage, and irritation of the skin around her eyes.  The skin is cracking.  She is having increased tearing.  Is very uncomfortable. She does not have any cream she can use in her eye area Vision is not impaired  Past Medical History:  Diagnosis Date  . Anemia   . Asthma   . Eczema     There are no problems to display for this patient.   History reviewed. No pertinent surgical history.  OB History   No obstetric history on file.      Home Medications    Prior to Admission medications   Medication Sig Start Date End Date Taking? Authorizing Provider  albuterol (VENTOLIN HFA) 108 (90 Base) MCG/ACT inhaler Inhale into the lungs every 6 (six) hours as needed for wheezing or shortness of breath.    [provider]  cetirizine (ZYRTEC ALLERGY) 10 MG tablet Take 1 tablet (10 mg total) by mouth daily. 08/10/19   Bast, Gloris Manchester A, NP  fluticasone (FLONASE) 50 MCG/ACT nasal spray Place 2 sprays into both nostrils daily. 12/07/18   Joy, Shawn C, PA-C  predniSONE (STERAPRED UNI-PAK 21 TAB) 10 MG (21) TBPK tablet Take by mouth daily. Take 6 tabs by mouth daily  for 2 days, then 5 tabs for 2 days, then 4 tabs for 2 days, then 3 tabs for 2 days, 2 tabs for 2 days, then 1 tab by mouth daily for 2 days 10/31/19   Eustace Moore, MD  tiZANidine (ZANAFLEX) 4 MG tablet Take 1 tablet (4 mg total) by mouth at bedtime. 09/06/19   Wallis Bamberg, PA-C  triamcinolone cream (KENALOG) 0.1 % Apply topically 2 (two) times daily. 09/23/19   Riki Sheer, PA-C    Family History Family History  Family history unknown: Yes     Social History Social History   Tobacco Use  . Smoking status: Never Smoker  . Smokeless tobacco: Never Used  Substance Use Topics  . Alcohol use: Never  . Drug use: Never     Allergies   Claritin [loratadine], Other, and Shellfish allergy   Review of Systems Review of Systems See HPI  Physical Exam Triage Vital Signs ED Triage Vitals  Enc Vitals Group     BP 10/31/19 1107 129/81     Pulse Rate 10/31/19 1107 79     Resp 10/31/19 1107 16     Temp 10/31/19 1107 98 F (36.7 C)     Temp Source 10/31/19 1107 Oral     SpO2 10/31/19 1107 98 %     Weight --      Height --      Head Circumference --      Peak Flow --      Pain Score 10/31/19 1116 6     Pain Loc --      Pain Edu? --      Excl. in [place]? --    No data found.  Updated Vital Signs BP 129/81 (BP Location: Right Arm)   Pulse 79   Temp  98 F (36.7 C) (Oral)   Resp 16   LMP 09/18/2019   SpO2 98%   Visual Acuity Right Eye Distance: 20/50 without corrective lens Left Eye Distance: 20/70 without corrective lens Bilateral Distance: 20/50 without corrective lens      Physical Exam Constitutional:      General: She is not in acute distress.    Appearance: She is well-developed.  HENT:     Head: Normocephalic and atraumatic.  Eyes:     General:        Right eye: No discharge.        Left eye: No discharge.     Conjunctiva/sclera: Conjunctivae normal.     Pupils: Pupils are equal, round, and reactive to light.     Comments: The skin of both eyes has hyperpigmentation, vesicles, swelling, cracking upper and lower lids  Cardiovascular:     Rate and Rhythm: Normal rate.  Pulmonary:     Effort: Pulmonary effort is normal. No respiratory distress.  Abdominal:     General: There is no distension.     Palpations: Abdomen is soft.  Musculoskeletal:        General: Normal range of motion.     Cervical back: Normal range of motion.  Skin:    General: Skin is warm and dry.  Neurological:     Mental  Status: She is alert.  Psychiatric:        Behavior: Behavior normal.      UC Treatments / Results  Labs (all labs ordered are listed, but only abnormal results are displayed) Labs Reviewed - No data to display  EKG   Radiology No results found.  Procedures Procedures (including critical care time)  Medications Ordered in UC Medications - No data to display  Initial Impression / Assessment and Plan / UC Course  I have reviewed the triage vital signs and the nursing notes.  Pertinent labs & imaging results that were available during my care of the patient were reviewed by me and considered in my medical decision making (see chart for details).     Final Clinical Impressions(s) / UC Diagnoses   Final diagnoses:  Eczematous dermatitis of upper and lower eyelids of both eyes     Discharge Instructions     Take prednisone as directed Take all of day 1 today Cool compresses to eyes For 2 days Call or return for problems   ED Prescriptions    Medication Sig Dispense Auth. Provider   predniSONE (STERAPRED UNI-PAK 21 TAB) 10 MG (21) TBPK tablet Take by mouth daily. Take 6 tabs by mouth daily  for 2 days, then 5 tabs for 2 days, then 4 tabs for 2 days, then 3 tabs for 2 days, 2 tabs for 2 days, then 1 tab by mouth daily for 2 days 42 tablet Delton See Letta Pate, MD     PDMP not reviewed this encounter.   Eustace Moore, MD 10/31/19 [phone]

## 2019-11-01 ENCOUNTER — Encounter: Payer: 59 | Admitting: Nurse Practitioner

## 2019-12-09 ENCOUNTER — Other Ambulatory Visit: Payer: Self-pay

## 2019-12-09 ENCOUNTER — Encounter (HOSPITAL_BASED_OUTPATIENT_CLINIC_OR_DEPARTMENT_OTHER): Payer: Self-pay

## 2019-12-09 ENCOUNTER — Emergency Department (HOSPITAL_BASED_OUTPATIENT_CLINIC_OR_DEPARTMENT_OTHER)
Admission: EM | Admit: 2019-12-09 | Discharge: 2019-12-09 | Disposition: A | Discharge status: Home / Self Care | Payer: 59 | Attending: Emergency Medicine | Admitting: Emergency Medicine

## 2019-12-09 DIAGNOSIS — R05 Cough: Secondary | ICD-10-CM | POA: Diagnosis present

## 2019-12-09 DIAGNOSIS — Z79899 Other long term (current) drug therapy: Secondary | ICD-10-CM | POA: Diagnosis not present

## 2019-12-09 DIAGNOSIS — J45909 Unspecified asthma, uncomplicated: Secondary | ICD-10-CM | POA: Diagnosis not present

## 2019-12-09 DIAGNOSIS — Z20822 Contact with and (suspected) exposure to covid-19: Secondary | ICD-10-CM | POA: Insufficient documentation

## 2019-12-09 DIAGNOSIS — J069 Acute upper respiratory infection, unspecified: Secondary | ICD-10-CM | POA: Diagnosis not present

## 2019-12-09 DIAGNOSIS — Z7951 Long term (current) use of inhaled steroids: Secondary | ICD-10-CM | POA: Insufficient documentation

## 2019-12-09 LAB — SARS CORONAVIRUS 2 BY RT PCR (HOSPITAL ORDER, PERFORMED IN ~~LOC~~ HOSPITAL LAB): SARS Coronavirus 2: NEGATIVE

## 2019-12-09 MED ORDER — PSEUDOEPHEDRINE HCL 60 MG PO TABS: 60.0000 mg | ORAL_TABLET | Freq: Four times a day (QID) | ORAL | 0 refills | Status: DC | PRN

## 2019-12-09 NOTE — ED Triage Notes (Signed)
Pt arrives with cough, runny nose, headache, body aches, and congestion since Wednesday. Pt is not vaccinated, denies any contact with someone with Covid.

## 2019-12-13 NOTE — ED Provider Notes (Signed)
[facility] EMERGENCY DEPARTMENT Provider Note   CSN: [id number] Arrival date & time: 12/09/19  9024     History Chief Complaint  Patient presents with  . Generalized Body Aches    Darlene Logan is a 44 y.o. female.  HPI   44yF with URI symptoms. Onset Wednesday. Cough, body aches, nasal congestion, mild headache. No fever. Cough is non-productive. No urinary complaints. No v/d. No sick contacts that she is aware of.   Past Medical History:  Diagnosis Date  . Anemia   . Asthma   . Eczema     There are no problems to display for this patient.   History reviewed. No pertinent surgical history.   OB History   No obstetric history on file.     Family History  Family history unknown: Yes    Social History   Tobacco Use  . Smoking status: Never Smoker  . Smokeless tobacco: Never Used  Substance Use Topics  . Alcohol use: Never  . Drug use: Never    Home Medications Prior to Admission medications   Medication Sig Start Date End Date Taking? Authorizing Provider  albuterol (VENTOLIN HFA) 108 (90 Base) MCG/ACT inhaler Inhale into the lungs every 6 (six) hours as needed for wheezing or shortness of breath.    [provider]  cetirizine (ZYRTEC ALLERGY) 10 MG tablet Take 1 tablet (10 mg total) by mouth daily. 08/10/19   Bast, Gloris Manchester A, NP  fluticasone (FLONASE) 50 MCG/ACT nasal spray Place 2 sprays into both nostrils daily. 12/07/18   Joy, Shawn C, PA-C  predniSONE (STERAPRED UNI-PAK 21 TAB) 10 MG (21) TBPK tablet Take by mouth daily. Take 6 tabs by mouth daily  for 2 days, then 5 tabs for 2 days, then 4 tabs for 2 days, then 3 tabs for 2 days, 2 tabs for 2 days, then 1 tab by mouth daily for 2 days 10/31/19   Eustace Moore, MD  pseudoephedrine (SUDAFED) 60 MG tablet Take 1 tablet (60 mg total) by mouth every 6 (six) hours as needed for congestion. 12/09/19   Raeford Razor, MD  tiZANidine (ZANAFLEX) 4 MG tablet Take 1 tablet (4 mg total) by mouth at  bedtime. 09/06/19   Wallis Bamberg, PA-C  triamcinolone cream (KENALOG) 0.1 % Apply topically 2 (two) times daily. 09/23/19   Riki Sheer, PA-C    Allergies    Claritin [loratadine], Other, and Shellfish allergy  Review of Systems   Review of Systems All systems reviewed and negative, other than as noted in HPI.  Physical Exam Updated Vital Signs BP 111/62 (BP Location: Right Arm)   Pulse (!) 103   Temp 98.5 F (36.9 C) (Oral)   Resp 18   Ht 5\' 6"  (1.676 m)   Wt 92.1 kg   SpO2 100%   BMI 32.77 kg/m   Physical Exam Vitals and nursing note reviewed.  Constitutional:      General: She is not in acute distress.    Appearance: She is well-developed.  HENT:     Head: Normocephalic and atraumatic.  Eyes:     General:        Right eye: No discharge.        Left eye: No discharge.     Conjunctiva/sclera: Conjunctivae normal.  Cardiovascular:     Rate and Rhythm: Normal rate and regular rhythm.     Heart sounds: Normal heart sounds. No murmur heard.  No friction rub. No gallop.   Pulmonary:  Effort: Pulmonary effort is normal. No respiratory distress.     Breath sounds: Normal breath sounds.  Abdominal:     General: There is no distension.     Palpations: Abdomen is soft.     Tenderness: There is no abdominal tenderness.  Musculoskeletal:        General: No tenderness.     Cervical back: Neck supple.  Skin:    General: Skin is warm and dry.  Neurological:     Mental Status: She is alert.  Psychiatric:        Behavior: Behavior normal.        Thought Content: Thought content normal.     ED Results / Procedures / Treatments   Labs (all labs ordered are listed, but only abnormal results are displayed) Labs Reviewed  SARS CORONAVIRUS 2 BY RT PCR (HOSPITAL ORDER, PERFORMED IN [facility])    EKG None  Radiology No results found.  Procedures Procedures (including critical care time)  Medications Ordered in ED Medications - No data to  display  ED Course  I have reviewed the triage vital signs and the nursing notes.  Pertinent labs & imaging results that were available during my care of the patient were reviewed by me and considered in my medical decision making (see chart for details).    MDM Rules/Calculators/A&P                          44yF with likely viral URI. Even if COVID, she appears well. O2 sats fine on RA. Symptomatic tx. Return precautions discussed.   Swayze Kozuch was evaluated in Emergency Department on 12/13/2019 for the symptoms described in the history of present illness. She was evaluated in the context of the global COVID-19 pandemic, which necessitated consideration that the patient might be at risk for infection with the SARS-CoV-2 virus that causes COVID-19. Institutional protocols and algorithms that pertain to the evaluation of patients at risk for COVID-19 are in a state of rapid change based on information released by regulatory bodies including the CDC and federal and state organizations. These policies and algorithms were followed during the patient's care in the ED. Final Clinical Impression(s) / ED Diagnoses Final diagnoses:  Viral URI    Rx / DC Orders ED Discharge Orders         Ordered    pseudoephedrine (SUDAFED) 60 MG tablet  Every 6 hours PRN        12/09/19 1011           Raeford Razor, MD 12/13/19 [phone]

## 2020-03-09 ENCOUNTER — Other Ambulatory Visit: Payer: 59

## 2020-03-09 DIAGNOSIS — Z20822 Contact with and (suspected) exposure to covid-19: Secondary | ICD-10-CM

## 2020-03-11 LAB — SARS-COV-2, NAA 2 DAY TAT

## 2020-03-11 LAB — NOVEL CORONAVIRUS, NAA: SARS-CoV-2, NAA: NOT DETECTED

## 2020-03-24 ENCOUNTER — Ambulatory Visit (HOSPITAL_COMMUNITY): Admission: EM | Admit: 2020-03-24 | Discharge: 2020-03-24 | Discharge status: Against Medical Advice | Payer: 59

## 2020-03-24 ENCOUNTER — Other Ambulatory Visit: Payer: Self-pay

## 2020-04-12 ENCOUNTER — Other Ambulatory Visit: Payer: Self-pay | Admitting: Internal Medicine

## 2020-04-12 DIAGNOSIS — Z1231 Encounter for screening mammogram for malignant neoplasm of breast: Secondary | ICD-10-CM

## 2020-05-11 ENCOUNTER — Ambulatory Visit (HOSPITAL_COMMUNITY)
Admission: EM | Admit: 2020-05-11 | Discharge: 2020-05-11 | Disposition: A | Discharge status: Home / Self Care | Payer: 59 | Attending: Family Medicine | Admitting: Family Medicine

## 2020-05-11 ENCOUNTER — Other Ambulatory Visit: Payer: Self-pay

## 2020-05-11 ENCOUNTER — Encounter (HOSPITAL_COMMUNITY): Payer: Self-pay | Admitting: Family Medicine

## 2020-05-11 DIAGNOSIS — L299 Pruritus, unspecified: Secondary | ICD-10-CM

## 2020-05-11 DIAGNOSIS — T7840XA Allergy, unspecified, initial encounter: Secondary | ICD-10-CM

## 2020-05-11 MED ORDER — PREDNISONE 20 MG PO TABS: 40.0000 mg | ORAL_TABLET | Freq: Every day | ORAL | 0 refills | Status: DC

## 2020-05-11 NOTE — ED Triage Notes (Signed)
Pt reports her skin broke out after changing soaps.  Pt has eczema and has been using some cream on affected skin areas with out  Help.

## 2020-05-11 NOTE — ED Provider Notes (Signed)
   [facility]   [id number] 05/11/20 Arrival Time: 1002  ASSESSMENT & PLAN:  1. Allergic reaction, initial encounter   2. Itching    No signs of eczema exacerbation. No resp difficulties. Begin: Meds ordered this encounter  Medications  . predniSONE (DELTASONE) 20 MG tablet    Sig: Take 2 tablets (40 mg total) by mouth daily.    Dispense:  10 tablet    Refill:  0    Follow-up Information    [name], MD.   Specialty: Internal Medicine Why: If worsening or failing to improve as anticipated. Contact information: [address] [place] [address] [phone]               Reviewed expectations re: course of current medical issues. Questions answered. Outlined signs and symptoms indicating need for more acute intervention. Patient verbalized understanding. After Visit Summary given.   SUBJECTIVE: History from: patient. Darlene Logan is a 45 y.o. female who presents for evaluation of a possible allergic reaction. Possible trigger: include new soap; intense itching after using yesterday. Denies: difficulty breathing, difficulty swallowing, wheezing, throat tightness, hoarseness, stridor, lightheadedness, dizziness, facial swelling, lip swelling and tongue swelling. Symptoms stable. No OTC tx. No n/v.  OBJECTIVE:  Vitals:   05/11/20 1021  BP: 122/69  Pulse: 86  Resp: 16  Temp: 98.3 F (36.8 C)  TempSrc: Oral  SpO2: 99%    General appearance: alert; no distress Eyes: PERRLA; EOMI; conjunctiva normal HENT: normocephalic; atraumatic; TMs normal; nasal mucosa normal; oral mucosa normal Neck: supple  Lungs: clear to auscultation bilaterally Heart: regular rate and rhythm Abdomen: soft, non-tender Back: no CVA tenderness Extremities: no cyanosis or edema; symmetrical with no gross deformities Skin: warm and dry; no significant skin lesions Neurologic: normal gait; normal symmetric reflexes Psychological: alert and cooperative;  normal mood and affect   Allergies  Allergen Reactions  . Claritin [Loratadine]   . Other     Flu vaccine  . Shellfish Allergy     Past Medical History:  Diagnosis Date  . Anemia   . Asthma   . Eczema    Social History   Socioeconomic History  . Marital status: Single    Spouse name: Not on file  . Number of children: Not on file  . Years of education: Not on file  . Highest education level: Not on file  Occupational History  . Not on file  Tobacco Use  . Smoking status: Never Smoker  . Smokeless tobacco: Never Used  Substance and Sexual Activity  . Alcohol use: Never  . Drug use: Never  . Sexual activity: Not on file  Other Topics Concern  . Not on file  Social History Narrative  . Not on file   Social Determinants of Health   Financial Resource Strain: Not on file  Food Insecurity: Not on file  Transportation Needs: Not on file  Physical Activity: Not on file  Stress: Not on file  Social Connections: Not on file  Intimate Partner Violence: Not on file   Family History  Family history unknown: Yes   History reviewed. No pertinent surgical history.   Mardella Layman, MD 05/11/20 1043

## 2020-05-28 ENCOUNTER — Ambulatory Visit: Payer: 59

## 2020-06-20 ENCOUNTER — Other Ambulatory Visit: Payer: Self-pay

## 2020-06-20 ENCOUNTER — Ambulatory Visit (HOSPITAL_COMMUNITY)
Admission: EM | Admit: 2020-06-20 | Discharge: 2020-06-20 | Disposition: A | Discharge status: Home / Self Care | Payer: 59 | Attending: Urgent Care | Admitting: Urgent Care

## 2020-06-20 ENCOUNTER — Encounter (HOSPITAL_COMMUNITY): Payer: Self-pay | Admitting: Emergency Medicine

## 2020-06-20 DIAGNOSIS — R140 Abdominal distension (gaseous): Secondary | ICD-10-CM | POA: Diagnosis not present

## 2020-06-20 DIAGNOSIS — Z3202 Encounter for pregnancy test, result negative: Secondary | ICD-10-CM

## 2020-06-20 DIAGNOSIS — K2900 Acute gastritis without bleeding: Secondary | ICD-10-CM

## 2020-06-20 DIAGNOSIS — R1084 Generalized abdominal pain: Secondary | ICD-10-CM

## 2020-06-20 LAB — POCT URINALYSIS DIPSTICK, ED / UC
Bilirubin Urine: NEGATIVE
Glucose, UA: NEGATIVE mg/dL
Hgb urine dipstick: NEGATIVE
Ketones, ur: NEGATIVE mg/dL
Nitrite: NEGATIVE
Protein, ur: NEGATIVE mg/dL
Specific Gravity, Urine: 1.025 (ref 1.005–1.030)
Urobilinogen, UA: 0.2 mg/dL (ref 0.0–1.0)
pH: 6.5 (ref 5.0–8.0)

## 2020-06-20 LAB — POC URINE PREG, ED: Preg Test, Ur: NEGATIVE

## 2020-06-20 MED ORDER — OMEPRAZOLE 20 MG PO CPDR: 20.0000 mg | DELAYED_RELEASE_CAPSULE | Freq: Every day | ORAL | 0 refills | Status: DC

## 2020-06-20 MED ORDER — FAMOTIDINE 20 MG PO TABS: 20.0000 mg | ORAL_TABLET | Freq: Two times a day (BID) | ORAL | 0 refills | Status: DC

## 2020-06-20 NOTE — ED Triage Notes (Signed)
Pt presents with Lower abdominal pain and nausea after eating xs 3-4 days. States pains intensifies with gas. States last BM was this am.

## 2020-06-20 NOTE — Discharge Instructions (Addendum)

## 2020-06-20 NOTE — ED Provider Notes (Signed)
[facility]   MRN: [id number] DOB: 10/03/1975  Subjective:   Darlene Logan is a 45 y.o. female presenting for 3 to 4-day history of acute onset persistent abdominal bloating, burping and gassiness, nausea without vomiting.  Patient states that passing gas is actually painful in her belly.  Denies fever, cough, chest pain, diarrhea, constipation.  Patient works third shift and reports that she finds it very difficult to cook and prepare her own meals.  She regularly eats fast food.  She does try to hydrate well, limits alcohol and energy drinks.  She did used to drink a lot of red bowls but is no longer doing this.  No current facility-administered medications for this encounter.  Current Outpatient Medications:  .  albuterol (VENTOLIN HFA) 108 (90 Base) MCG/ACT inhaler, Inhale into the lungs every 6 (six) hours as needed for wheezing or shortness of breath., Disp: , Rfl:  .  cetirizine (ZYRTEC ALLERGY) 10 MG tablet, Take 1 tablet (10 mg total) by mouth daily., Disp: 30 tablet, Rfl: 2 .  fluticasone (FLONASE) 50 MCG/ACT nasal spray, Place 2 sprays into both nostrils daily., Disp: 16 g, Rfl: 2 .  predniSONE (DELTASONE) 20 MG tablet, Take 2 tablets (40 mg total) by mouth daily., Disp: 10 tablet, Rfl: 0 .  pseudoephedrine (SUDAFED) 60 MG tablet, Take 1 tablet (60 mg total) by mouth every 6 (six) hours as needed for congestion., Disp: 10 tablet, Rfl: 0 .  tiZANidine (ZANAFLEX) 4 MG tablet, Take 1 tablet (4 mg total) by mouth at bedtime., Disp: 30 tablet, Rfl: 0 .  triamcinolone cream (KENALOG) 0.1 %, Apply topically 2 (two) times daily., Disp: 30 g, Rfl: 0   Allergies  Allergen Reactions  . Claritin [Loratadine]   . Other     Flu vaccine  . Shellfish Allergy     Past Medical History:  Diagnosis Date  . Anemia   . Asthma   . Eczema      History reviewed. No pertinent surgical history.  Family History  Family history unknown: Yes    Social History   Tobacco  Use  . Smoking status: Never Smoker  . Smokeless tobacco: Never Used  Substance Use Topics  . Alcohol use: Never  . Drug use: Never    ROS   Objective:   Vitals: BP 109/73 (BP Location: Right Arm)   Pulse 98   Temp 99.3 F (37.4 C) (Oral)   Resp 18   LMP 06/07/2020   SpO2 100%   Physical Exam Constitutional:      General: She is not in acute distress.    Appearance: Normal appearance. She is well-developed and normal weight. She is not ill-appearing, toxic-appearing or diaphoretic.  HENT:     Head: Normocephalic and atraumatic.     Right Ear: External ear normal.     Left Ear: External ear normal.     Nose: Nose normal.     Mouth/Throat:     Mouth: Mucous membranes are moist.     Pharynx: Oropharynx is clear.  Eyes:     General: No scleral icterus.    Extraocular Movements: Extraocular movements intact.     Pupils: Pupils are equal, round, and reactive to light.  Cardiovascular:     Rate and Rhythm: Normal rate and regular rhythm.     Pulses: Normal pulses.     Heart sounds: Normal heart sounds. No murmur heard. No friction rub. No gallop.   Pulmonary:     Effort:  Pulmonary effort is normal. No respiratory distress.     Breath sounds: Normal breath sounds. No stridor. No wheezing, rhonchi or rales.  Abdominal:     General: Bowel sounds are normal. There is no distension.     Palpations: Abdomen is soft. There is no mass.     Tenderness: There is generalized abdominal tenderness and tenderness in the right upper quadrant, right lower quadrant, epigastric area, periumbilical area and suprapubic area. There is no right CVA tenderness, left CVA tenderness, guarding or rebound.  Skin:    General: Skin is warm and dry.     Coloration: Skin is not pale.     Findings: No rash.  Neurological:     General: No focal deficit present.     Mental Status: She is alert and oriented to person, place, and time.  Psychiatric:        Mood and Affect: Mood normal.         Behavior: Behavior normal.        Thought Content: Thought content normal.        Judgment: Judgment normal.     Negative pregnancy test by report.  Result did not crossover.  Assessment and Plan :   PDMP not reviewed this encounter.  1. Acute gastritis, presence of bleeding unspecified, unspecified gastritis type   2. Generalized abdominal pain   3. Abdominal bloating     No signs of an acute abdomen, suspect acute gastritis secondary to her dietary habits.  Emphasized need for dietary modifications.  Provided with healthy diet information.  Start famotidine and Prilosec. Counseled patient on potential for adverse effects with medications prescribed/recommended today, ER and return-to-clinic precautions discussed, patient verbalized understanding.    Darlene Logan, [place] 06/20/20 [phone]

## 2020-07-11 ENCOUNTER — Ambulatory Visit (INDEPENDENT_AMBULATORY_CARE_PROVIDER_SITE_OTHER): Payer: 59 | Admitting: Gastroenterology

## 2020-07-11 ENCOUNTER — Other Ambulatory Visit: Payer: Self-pay

## 2020-07-11 ENCOUNTER — Encounter: Payer: Self-pay | Admitting: Gastroenterology

## 2020-07-11 VITALS — BP 124/60 | HR 96 | Ht 65.0 in | Wt 199.2 lb

## 2020-07-11 DIAGNOSIS — R194 Change in bowel habit: Secondary | ICD-10-CM | POA: Diagnosis not present

## 2020-07-11 DIAGNOSIS — D500 Iron deficiency anemia secondary to blood loss (chronic): Secondary | ICD-10-CM

## 2020-07-11 DIAGNOSIS — R195 Other fecal abnormalities: Secondary | ICD-10-CM

## 2020-07-11 DIAGNOSIS — R1030 Lower abdominal pain, unspecified: Secondary | ICD-10-CM | POA: Diagnosis not present

## 2020-07-11 MED ORDER — NA SULFATE-K SULFATE-MG SULF 17.5-3.13-1.6 GM/177ML PO SOLN: 1.0000 | Freq: Once | ORAL | 0 refills | Status: AC

## 2020-07-11 NOTE — Progress Notes (Signed)
History of Present Illness: This is a 45 year old female referred by [name], MD for the evaluation of lower abdominal pain, change in bowel habits with softer stools, mucus in stool, fecal urgency and iron deficiency anemia.  She has a history of menorrhagia.  Recent hemoglobin=8.0, MCV=71.5 and ferritin=4.  She was started on omeprazole and famotidine and she felt her symptoms worsen so she discontinued both her GI symptoms have improved after starting on dicyclomine.  She still has small amounts of mucus in her stools and slightly more urgent stools.  Her abdominal pain has substantially improved. Denies weight loss, constipation, diarrhea, change in stool caliber, melena, hematochezia, nausea, vomiting, dysphagia, reflux symptoms, chest pain.  Allergies  Allergen Reactions  . Claritin [Loratadine]   . Other     Flu vaccine  . Pecan Nut (Diagnostic)     Macadamia, walnuts  . Shellfish Allergy   . Albumen, Egg   . Coconut Fatty Acids Rash   Outpatient Medications Prior to Visit  Medication Sig Dispense Refill  . ADVAIR DISKUS 250-50 MCG/DOSE AEPB Inhale 1 puff into the lungs 2 (two) times daily.    Marland Kitchen albuterol (VENTOLIN HFA) 108 (90 Base) MCG/ACT inhaler Inhale into the lungs every 6 (six) hours as needed for wheezing or shortness of breath.    . cetirizine (ZYRTEC ALLERGY) 10 MG tablet Take 1 tablet (10 mg total) by mouth daily. 30 tablet 2  . ciprofloxacin (CIPRO) 500 MG tablet Take 1 tablet by mouth every 12 (twelve) hours.    Marland Kitchen dicyclomine (BENTYL) 20 MG tablet Take 20 mg by mouth 4 (four) times daily.    . famotidine (PEPCID) 20 MG tablet Take 1 tablet (20 mg total) by mouth 2 (two) times daily. 60 tablet 0  . ferrous sulfate 325 (65 FE) MG tablet Take 325 mg by mouth daily with breakfast.    . fluticasone (FLONASE) 50 MCG/ACT nasal spray Place 2 sprays into both nostrils daily. 16 g 2  . omeprazole (PRILOSEC) 20 MG capsule Take 1 capsule (20 mg total) by mouth daily.  90 capsule 0  . predniSONE (DELTASONE) 20 MG tablet Take 2 tablets (40 mg total) by mouth daily. 10 tablet 0  . triamcinolone cream (KENALOG) 0.1 % Apply topically 2 (two) times daily. 30 g 0  . pseudoephedrine (SUDAFED) 60 MG tablet Take 1 tablet (60 mg total) by mouth every 6 (six) hours as needed for congestion. 10 tablet 0  . tiZANidine (ZANAFLEX) 4 MG tablet Take 1 tablet (4 mg total) by mouth at bedtime. 30 tablet 0   No facility-administered medications prior to visit.   Past Medical History:  Diagnosis Date  . Anemia   . Asthma   . Eczema   . Obesity   . Seasonal allergies    Past Surgical History:  Procedure Laterality Date  . NO PAST SURGERIES     Social History   Socioeconomic History  . Marital status: Single    Spouse name: Not on file  . Number of children: 5  . Years of education: Not on file  . Highest education level: Not on file  Occupational History  . Occupation: [employer]  Tobacco Use  . Smoking status: Former Smoker    Types: Cigarettes    Quit date: 07/12/2007    Years since quitting: 13.0  . Smokeless tobacco: Never Used  Vaping Use  . Vaping Use: Never used  Substance and Sexual Activity  . Alcohol use: Yes    Comment:  occasional  . Drug use: Never  . Sexual activity: Not on file  Other Topics Concern  . Not on file  Social History Narrative  . Not on file   Social Determinants of Health   Financial Resource Strain: Not on file  Food Insecurity: Not on file  Transportation Needs: Not on file  Physical Activity: Not on file  Stress: Not on file  Social Connections: Not on file   Family History  Problem Relation Age of Onset  . Hypertension Maternal Aunt   . HIV/AIDS Mother   . Diabetes Sister   . Diabetes Maternal Aunt      Review of Systems: Pertinent positive and negative review of systems were noted in the above HPI section. All other review of systems were otherwise negative.   Physical Exam: General: Well developed, well  nourished, no acute distress Head: Normocephalic and atraumatic Eyes: Sclerae anicteric, EOMI Ears: Normal auditory acuity Mouth: Not examined, mask on during Covid-19 pandemic Neck: Supple, no masses or thyromegaly Lungs: Clear throughout to auscultation Heart: Regular rate and rhythm; no murmurs, rubs or bruits Abdomen: Soft, non tender and non distended. No masses, hepatosplenomegaly or hernias noted. Normal Bowel sounds Rectal: Deferred to colonoscopy Musculoskeletal: Symmetrical with no gross deformities  Skin: No lesions on visible extremities Pulses:  Normal pulses noted Extremities: No clubbing, cyanosis, edema or deformities noted Neurological: Alert oriented x 4, grossly nonfocal Cervical Nodes:  No significant cervical adenopathy Inguinal Nodes: No significant inguinal adenopathy Psychological:  Alert and cooperative. Normal mood and affect   Assessment and Recommendations:  1.  Change in bowel habits with softer stools, fecal urgency, mucus in stool, lower abdominal pain and iron deficiency anemia.  GI symptoms have improved substantially on dicyclomine 20 mg 4 times daily which we will continue.  Menorrhagia could explain her iron deficiency.  Rule out colorectal neoplasms, IBD and other disorders.  Schedule colonoscopy. The risks (including bleeding, perforation, infection, missed lesions, medication reactions and possible hospitalization or surgery if complications occur), benefits, and alternatives to colonoscopy with possible biopsy and possible polypectomy were discussed with the patient and they consent to proceed.   2.  Iron deficiency anemia.  Rule out colorectal sources however menorrhagia is the likely cause.  Continue ferrous sulfate 325 mg daily.  Hold 5 days prior to colonoscopy.    cc: [name], MD [address] [place],  [place] [address]

## 2020-07-11 NOTE — Patient Instructions (Signed)
You have been scheduled for a colonoscopy. Please follow written instructions given to you at your visit today.  Please pick up your prep supplies at the pharmacy within the next 1-3 days. If you use inhalers (even only as needed), please bring them with you on the day of your procedure.   Normal BMI (Body Mass Index- based on height and weight) is between 19 and 25. Your BMI today is Body mass index is 33.16 kg/m. Marland Kitchen Please consider follow up  regarding your BMI with your Primary Care Provider.  Thank you for choosing me and [facility].  Venita Lick. Pleas Koch., MD., Clementeen Graham

## 2020-07-16 ENCOUNTER — Ambulatory Visit: Payer: Medicaid Other

## 2020-08-13 ENCOUNTER — Emergency Department (HOSPITAL_BASED_OUTPATIENT_CLINIC_OR_DEPARTMENT_OTHER): Payer: 59

## 2020-08-13 ENCOUNTER — Other Ambulatory Visit: Payer: Self-pay

## 2020-08-13 ENCOUNTER — Encounter (HOSPITAL_BASED_OUTPATIENT_CLINIC_OR_DEPARTMENT_OTHER): Payer: Self-pay | Admitting: *Deleted

## 2020-08-13 ENCOUNTER — Emergency Department (HOSPITAL_BASED_OUTPATIENT_CLINIC_OR_DEPARTMENT_OTHER)
Admission: EM | Admit: 2020-08-13 | Discharge: 2020-08-13 | Disposition: A | Discharge status: Home / Self Care | Payer: 59 | Attending: Emergency Medicine | Admitting: Emergency Medicine

## 2020-08-13 DIAGNOSIS — R079 Chest pain, unspecified: Secondary | ICD-10-CM

## 2020-08-13 DIAGNOSIS — Z7951 Long term (current) use of inhaled steroids: Secondary | ICD-10-CM | POA: Insufficient documentation

## 2020-08-13 DIAGNOSIS — J45909 Unspecified asthma, uncomplicated: Secondary | ICD-10-CM | POA: Diagnosis not present

## 2020-08-13 DIAGNOSIS — Z87891 Personal history of nicotine dependence: Secondary | ICD-10-CM | POA: Diagnosis not present

## 2020-08-13 LAB — TROPONIN I (HIGH SENSITIVITY)
Troponin I (High Sensitivity): 2 ng/L (ref <18)
Troponin I (High Sensitivity): 3 ng/L (ref <18)

## 2020-08-13 LAB — BASIC METABOLIC PANEL
Anion gap: 8 (ref 5–15)
BUN: 14 mg/dL (ref 6–20)
CO2: 24 mmol/L (ref 22–32)
Calcium: 9 mg/dL (ref 8.9–10.3)
Chloride: 103 mmol/L (ref 98–111)
Creatinine, Ser: 1.06 mg/dL — ABNORMAL HIGH (ref 0.44–1.00)
GFR, Estimated: >60 mL/min (ref >60)
Glucose, Bld: 98 mg/dL (ref 70–99)
Potassium: 4.2 mmol/L (ref 3.5–5.1)
Sodium: 135 mmol/L (ref 135–145)

## 2020-08-13 LAB — CBC WITH DIFFERENTIAL/PLATELET
Abs Immature Granulocytes: 0.02 10*3/uL (ref 0.00–0.07)
Basophils Absolute: 0 10*3/uL (ref 0.0–0.1)
Basophils Relative: 0 %
Eosinophils Absolute: 0.3 10*3/uL (ref 0.0–0.5)
Eosinophils Relative: 5 %
HCT: 29.5 % — ABNORMAL LOW (ref 36.0–46.0)
Hemoglobin: 8.6 g/dL — ABNORMAL LOW (ref 12.0–15.0)
Immature Granulocytes: 0 %
Lymphocytes Relative: 28 %
Lymphs Abs: 1.7 10*3/uL (ref 0.7–4.0)
MCH: 21 pg — ABNORMAL LOW (ref 26.0–34.0)
MCHC: 29.2 g/dL — ABNORMAL LOW (ref 30.0–36.0)
MCV: 72 fL — ABNORMAL LOW (ref 80.0–100.0)
Monocytes Absolute: 0.5 10*3/uL (ref 0.1–1.0)
Monocytes Relative: 8 %
Neutro Abs: 3.5 10*3/uL (ref 1.7–7.7)
Neutrophils Relative %: 59 %
Platelets: 498 10*3/uL — ABNORMAL HIGH (ref 150–400)
RBC: 4.1 MIL/uL (ref 3.87–5.11)
RDW: 19.4 % — ABNORMAL HIGH (ref 11.5–15.5)
WBC: 6 10*3/uL (ref 4.0–10.5)
nRBC: 0 % (ref 0.0–0.2)

## 2020-08-13 LAB — PREGNANCY, URINE: Preg Test, Ur: NEGATIVE

## 2020-08-13 MED ORDER — NAPROXEN 500 MG PO TABS: 500.0000 mg | ORAL_TABLET | Freq: Two times a day (BID) | ORAL | 0 refills | Status: DC

## 2020-08-13 MED ORDER — KETOROLAC TROMETHAMINE 30 MG/ML IJ SOLN
30.0000 mg | Freq: Once | INTRAMUSCULAR | Status: AC
  Administered 2020-08-13: 30 mg via INTRAVENOUS
  Filled 2020-08-13: qty 1

## 2020-08-13 MED ORDER — METHOCARBAMOL 500 MG PO TABS: 500.0000 mg | ORAL_TABLET | Freq: Two times a day (BID) | ORAL | 0 refills | Status: DC

## 2020-08-13 NOTE — ED Triage Notes (Signed)
4 days of pain in her right chest with radiation under her arm into her scapula. Pain comes and goes. She took Ibuprofen this am for the pain.

## 2020-08-13 NOTE — ED Provider Notes (Signed)
[facility] EMERGENCY DEPARTMENT Provider Note   CSN: [id number] Arrival date & time: 08/13/20  1648     History Chief Complaint  Patient presents with  . Chest Pain    Darlene Logan is a 45 y.o. female.  Darlene Logan is a 45 y.o. female with a history of asthma, anemia, obesity, seasonal allergies, who presents to the emergency department for evaluation of right-sided chest pain.  Patient reports pain began 4 days ago.  Pain is present in the right upper chest and radiates into her scapula and under her arm.  Pain seems to be intermittent.  It is made worse with certain movements.  She reports it is in particular worse with movement of her right arm and the work she does requires constant repetitive movements of her arm.  She denies any shortness of breath.  Pain is not worse with deep breath.  Pain is not worse with exertion.  No skin changes noted.  No associated abdominal pain, nausea or vomiting.  No lightheadedness or syncope.  No lower extremity swelling or pain.  She has never had similar pain.  She took some ibuprofen this morning for pain and was able to get some rest but pain was still present when she woke up today which concerned her.  No other aggravating or alleviating factors.        Past Medical History:  Diagnosis Date  . Anemia   . Asthma   . Eczema   . Obesity   . Seasonal allergies     There are no problems to display for this patient.   Past Surgical History:  Procedure Laterality Date  . NO PAST SURGERIES       OB History   No obstetric history on file.     Family History  Problem Relation Age of Onset  . Hypertension Maternal Aunt   . HIV/AIDS Mother   . Diabetes Sister   . Diabetes Maternal Aunt     Social History   Tobacco Use  . Smoking status: Former Smoker    Types: Cigarettes    Quit date: 07/12/2007    Years since quitting: 13.1  . Smokeless tobacco: Never Used  Vaping Use  . Vaping Use: Never used  Substance  Use Topics  . Alcohol use: Yes    Comment: occasional  . Drug use: Never    Home Medications Prior to Admission medications   Medication Sig Start Date End Date Taking? Authorizing Provider  ADVAIR DISKUS 250-50 MCG/DOSE AEPB Inhale 1 puff into the lungs 2 (two) times daily. 06/11/20  Yes [provider]  albuterol (VENTOLIN HFA) 108 (90 Base) MCG/ACT inhaler Inhale into the lungs every 6 (six) hours as needed for wheezing or shortness of breath.   Yes [provider]  ferrous sulfate 325 (65 FE) MG tablet Take 325 mg by mouth daily with breakfast.   Yes [provider]  methocarbamol (ROBAXIN) 500 MG tablet Take 1 tablet (500 mg total) by mouth 2 (two) times daily. 08/13/20  Yes Dartha Lodge, PA-C  naproxen (NAPROSYN) 500 MG tablet Take 1 tablet (500 mg total) by mouth 2 (two) times daily. 08/13/20  Yes Dartha Lodge, PA-C  cetirizine (ZYRTEC ALLERGY) 10 MG tablet Take 1 tablet (10 mg total) by mouth daily. 08/10/19   Dahlia Byes A, NP  ciprofloxacin (CIPRO) 500 MG tablet Take 1 tablet by mouth every 12 (twelve) hours. 07/09/20   [provider]  dicyclomine (BENTYL) 20 MG tablet  Take 20 mg by mouth 4 (four) times daily. 07/09/20   [provider]  famotidine (PEPCID) 20 MG tablet Take 1 tablet (20 mg total) by mouth 2 (two) times daily. 06/20/20   Wallis Bamberg, PA-C  fluticasone (FLONASE) 50 MCG/ACT nasal spray Place 2 sprays into both nostrils daily. 12/07/18   Joy, Shawn C, PA-C  omeprazole (PRILOSEC) 20 MG capsule Take 1 capsule (20 mg total) by mouth daily. 06/20/20   Wallis Bamberg, PA-C  predniSONE (DELTASONE) 20 MG tablet Take 2 tablets (40 mg total) by mouth daily. 05/11/20   Mardella Layman, MD  triamcinolone cream (KENALOG) 0.1 % Apply topically 2 (two) times daily. 09/23/19   Riki Sheer, PA-C    Allergies    Claritin [loratadine]; Other; Pecan nut (diagnostic); Shellfish allergy; Albumen, egg; and Coconut fatty acids  Review of Systems    Review of Systems  Constitutional: Negative for chills and fever.  HENT: Negative.   Respiratory: Negative for cough and shortness of breath.   Cardiovascular: Positive for chest pain. Negative for palpitations and leg swelling.  Gastrointestinal: Negative for abdominal pain, nausea and vomiting.  Musculoskeletal: Negative for arthralgias and myalgias.  Skin: Negative for color change and rash.  Neurological: Negative for dizziness, syncope and light-headedness.  All other systems reviewed and are negative.   Physical Exam Updated Vital Signs BP 117/74 (BP Location: Left Arm)   Pulse 94   Temp 98.4 F (36.9 C) (Oral)   Resp 20   Ht 5\' 5"  (1.651 m)   Wt 90.4 kg   SpO2 99%   BMI 33.16 kg/m   Physical Exam Vitals and nursing note reviewed.  Constitutional:      General: She is not in acute distress.    Appearance: She is well-developed. She is not ill-appearing or diaphoretic.  HENT:     Head: Normocephalic and atraumatic.  Eyes:     General:        Right eye: No discharge.        Left eye: No discharge.     Pupils: Pupils are equal, round, and reactive to light.  Cardiovascular:     Rate and Rhythm: Normal rate and regular rhythm.     Pulses:          Radial pulses are 2+ on the right side and 2+ on the left side.     Heart sounds: Normal heart sounds. No murmur heard. No friction rub. No gallop.   Pulmonary:     Effort: Pulmonary effort is normal. No respiratory distress.     Breath sounds: Normal breath sounds. No wheezing or rales.     Comments: Respirations equal and unlabored, patient able to speak in full sentences, lungs clear to auscultation bilaterally  Chest:     Chest wall: Tenderness present.     Comments: Pain persistently reproducible with palpation over the right side of the chest, in particular over the right costochondral margin, also worse with movement of the right arm Abdominal:     General: Bowel sounds are normal. There is no distension.      Palpations: Abdomen is soft. There is no mass.     Tenderness: There is no abdominal tenderness. There is no guarding.     Comments: Abdomen soft, nondistended, nontender to palpation in all quadrants without guarding or peritoneal signs   Musculoskeletal:        General: No deformity.     Cervical back: Neck supple.     Right lower  leg: No tenderness. No edema.     Left lower leg: No tenderness. No edema.  Skin:    General: Skin is warm and dry.     Capillary Refill: Capillary refill takes less than 2 seconds.  Neurological:     Mental Status: She is alert.     Coordination: Coordination normal.     Comments: Speech is clear, able to follow commands Moves extremities without ataxia, coordination intact  Psychiatric:        Mood and Affect: Mood normal.        Behavior: Behavior normal.     ED Results / Procedures / Treatments   Labs (all labs ordered are listed, but only abnormal results are displayed) Labs Reviewed  BASIC METABOLIC PANEL - Abnormal; Notable for the following components:      Result Value   Creatinine, Ser 1.06 (*)    All other components within normal limits  CBC WITH DIFFERENTIAL/PLATELET - Abnormal; Notable for the following components:   Hemoglobin 8.6 (*)    HCT 29.5 (*)    MCV 72.0 (*)    MCH 21.0 (*)    MCHC 29.2 (*)    RDW 19.4 (*)    Platelets 498 (*)    All other components within normal limits  PREGNANCY, URINE  TROPONIN I (HIGH SENSITIVITY)  TROPONIN I (HIGH SENSITIVITY)    EKG EKG Interpretation  Date/Time:  Tuesday Aug 13 2020 17:14:26 EDT Ventricular Rate:  90 PR Interval:  134 QRS Duration: 86 QT Interval:  338 QTC Calculation: 413 R Axis:   57 Text Interpretation: Normal sinus rhythm Normal ECG No previous tracing Confirmed by Gwyneth Sprout ([id number]) on 08/13/2020 6:13:32 PM   Radiology DG Chest 2 View  Result Date: 08/13/2020 CLINICAL DATA:  Chest pain, worse with certain movements EXAM: CHEST - 2 VIEW COMPARISON:   03/30/2018 FINDINGS: No consolidation, features of edema, pneumothorax, or effusion. Pulmonary vascularity is normally distributed. The cardiomediastinal contours are unremarkable. No acute osseous or soft tissue abnormality. IMPRESSION: No acute cardiopulmonary abnormality. Electronically Signed   By: Kreg Shropshire M.D.   On: 08/13/2020 18:04    Procedures Procedures   Medications Ordered in ED Medications  ketorolac (TORADOL) 30 MG/ML injection 30 mg (30 mg Intravenous Given 08/13/20 1820)    ED Course  I have reviewed the triage vital signs and the nursing notes.  Pertinent labs & imaging results that were available during my care of the patient were reviewed by me and considered in my medical decision making (see chart for details).    MDM Rules/Calculators/A&P                         Patient presents to the emergency department with chest pain. Patient nontoxic appearing, in no apparent distress, vitals without significant abnormality. Fairly benign physical exam.   DDX: ACS, pulmonary embolism, dissection, pneumothorax, pneumonia, arrhythmia, severe anemia, MSK, GERD, anxiety, abdominal process.   Additional history obtained:  Additional history obtained from chart review & nursing note review.   EKG: Normal sinus rhythm with no concerning ischemic changes  Lab Tests:  I Ordered, reviewed, and interpreted labs, which included:  CBC: No leukocytosis, hemoglobin slightly improved from baseline 8.6 when compared to recent notes from GI, currently on iron supplementation, no bleeding symptoms BMP: Creatinine 1.06, no electrolyte derangements Troponin: Negative x2  Imaging Studies ordered:  I ordered imaging studies which included CXR, I independently reviewed, formal radiology impression shows:  No  acute cardiopulmonary disease  ED Course:   Heart Pathway Score 1 - EKG without obvious acute ischemia, delta troponin negative, doubt ACS. Patient is low risk wells, PERC negative,  doubt pulmonary embolism. Pain is not a tearing sensation, symmetric pulses, no widening of mediastinum on CXR, doubt dissection. Cardiac monitor reviewed, no notable arrhythmias or tachycardia.  Pain is consistently reproducible with palpation, suspect musculoskeletal chest pain.  Also worse with movement and does repetitive movements of her right arm at work.  Patient has appeared hemodynamically stable throughout ER visit and appears safe for discharge with close PCP/cardiology follow up. I discussed results, treatment plan, need for PCP follow-up, and return precautions with the patient. Provided opportunity for questions, patient confirmed understanding and is in agreement with plan.    Portions of this note were generated with Scientist, clinical (histocompatibility and immunogenetics)Dragon dictation software. Dictation errors may occur despite best attempts at proofreading.    Final Clinical Impression(s) / ED Diagnoses Final diagnoses:  Right-sided chest pain    Rx / DC Orders ED Discharge Orders         Ordered    naproxen (NAPROSYN) 500 MG tablet  2 times daily        08/13/20 2039    methocarbamol (ROBAXIN) 500 MG tablet  2 times daily        08/13/20 2039           Dartha LodgeFord, Reiss Mowrey N, [place] JerseyPA-C 08/14/20 [id number]    Gwyneth SproutPlunkett, Whitney, MD 08/21/20 [id number]

## 2020-08-13 NOTE — ED Notes (Signed)
ED Provider at bedside. 

## 2020-08-13 NOTE — Discharge Instructions (Signed)
You were seen in the emergency department today for chest pain. Your work-up in the emergency department has been overall reassuring. Your labs have been fairly normal and or similar to previous blood work you have had done. Your EKG and the enzyme we use to check your heart did not show an acute heart attack at this time. Your chest x-ray was normal.   To treat for musculoskeletal chest pain take naproxen as prescribed twice daily for at least 1 week, you can also use Robaxin to help with potential muscle spasm, this can cause some drowsiness, do not take before driving.  We would like you to follow up closely with your primary care provider with 1 week. Return to the ER immediately should you experience any new or worsening symptoms including but not limited to return of pain, worsened pain, vomiting, shortness of breath, dizziness, lightheadedness, passing out, or any other concerns that you may have.

## 2020-08-13 NOTE — ED Notes (Signed)
Patient transported to X-ray 

## 2020-08-13 NOTE — ED Provider Notes (Signed)
Emergency Medicine Provider Triage Evaluation Note  Darlene Logan , a 45 y.o. female  was evaluated in triage.  Pt complains of right-sided chest pain.  Symptoms started on Friday, pain was initially intermittent, but today has been more persistent.  Pain is worse with certain movements.  Pain is not pleuritic or exertional.  No prior heart history, does have history of asthma.  Review of Systems  Positive: Chest pain Negative: Syncope, shortness of breath  Physical Exam  BP 117/74 (BP Location: Left Arm)   Pulse 94   Temp 98.4 F (36.9 C) (Oral)   Resp 20   Ht 5\' 5"  (1.651 m)   Wt 90.4 kg   SpO2 99%   BMI 33.16 kg/m  Gen:   Awake, no distress   Resp:  Normal effort Cardiac:  RRR, right chest tender to palpation MSK:   Moves extremities without difficulty   Medical Decision Making  Medically screening exam initiated at 5:15 PM.  Appropriate orders placed.  Darlene Logan was informed that the remainder of the evaluation will be completed by another provider, this initial triage assessment does not replace that evaluation, and the importance of remaining in the ED until their evaluation is complete.     , PA-C 08/13/20 1716    08/15/20, MD 08/21/20 [phone]

## 2020-09-01 ENCOUNTER — Emergency Department (HOSPITAL_BASED_OUTPATIENT_CLINIC_OR_DEPARTMENT_OTHER)
Admission: EM | Admit: 2020-09-01 | Discharge: 2020-09-01 | Disposition: A | Discharge status: Home / Self Care | Payer: 59 | Attending: Emergency Medicine | Admitting: Emergency Medicine

## 2020-09-01 ENCOUNTER — Encounter (HOSPITAL_BASED_OUTPATIENT_CLINIC_OR_DEPARTMENT_OTHER): Payer: Self-pay

## 2020-09-01 ENCOUNTER — Other Ambulatory Visit: Payer: Self-pay

## 2020-09-01 DIAGNOSIS — Z87891 Personal history of nicotine dependence: Secondary | ICD-10-CM | POA: Insufficient documentation

## 2020-09-01 DIAGNOSIS — Z20822 Contact with and (suspected) exposure to covid-19: Secondary | ICD-10-CM | POA: Diagnosis not present

## 2020-09-01 DIAGNOSIS — Z91010 Allergy to peanuts: Secondary | ICD-10-CM | POA: Diagnosis not present

## 2020-09-01 DIAGNOSIS — R0602 Shortness of breath: Secondary | ICD-10-CM | POA: Diagnosis present

## 2020-09-01 DIAGNOSIS — J4521 Mild intermittent asthma with (acute) exacerbation: Secondary | ICD-10-CM | POA: Insufficient documentation

## 2020-09-01 DIAGNOSIS — Z7951 Long term (current) use of inhaled steroids: Secondary | ICD-10-CM | POA: Diagnosis not present

## 2020-09-01 MED ORDER — ALBUTEROL SULFATE (2.5 MG/3ML) 0.083% IN NEBU
INHALATION_SOLUTION | RESPIRATORY_TRACT | Status: AC
  Administered 2020-09-01: 2.5 mg via RESPIRATORY_TRACT
  Filled 2020-09-01: qty 3

## 2020-09-01 MED ORDER — PREDNISONE 10 MG PO TABS: 40.0000 mg | ORAL_TABLET | Freq: Every day | ORAL | 0 refills | Status: AC

## 2020-09-01 MED ORDER — ALBUTEROL SULFATE (2.5 MG/3ML) 0.083% IN NEBU: 2.5000 mg | INHALATION_SOLUTION | Freq: Four times a day (QID) | RESPIRATORY_TRACT | 12 refills | Status: DC | PRN

## 2020-09-01 MED ORDER — IPRATROPIUM-ALBUTEROL 0.5-2.5 (3) MG/3ML IN SOLN: 3.0000 mL | Freq: Once | RESPIRATORY_TRACT | Status: AC

## 2020-09-01 MED ORDER — IPRATROPIUM-ALBUTEROL 0.5-2.5 (3) MG/3ML IN SOLN
RESPIRATORY_TRACT | Status: AC
  Administered 2020-09-01: 3 mL via RESPIRATORY_TRACT
  Filled 2020-09-01: qty 3

## 2020-09-01 MED ORDER — ALBUTEROL SULFATE (2.5 MG/3ML) 0.083% IN NEBU: 2.5000 mg | INHALATION_SOLUTION | Freq: Once | RESPIRATORY_TRACT | Status: AC

## 2020-09-01 MED ORDER — PREDNISONE 50 MG PO TABS
60.0000 mg | ORAL_TABLET | Freq: Once | ORAL | Status: AC
  Administered 2020-09-01: 60 mg via ORAL
  Filled 2020-09-01: qty 1

## 2020-09-01 MED ORDER — ALBUTEROL SULFATE HFA 108 (90 BASE) MCG/ACT IN AERS
2.0000 | INHALATION_SPRAY | Freq: Once | RESPIRATORY_TRACT | Status: AC
  Administered 2020-09-01: 2 via RESPIRATORY_TRACT
  Filled 2020-09-01: qty 6.7

## 2020-09-01 NOTE — ED Notes (Signed)
Late note Patient ambulated from lobby to room 6, SpO2 98-100, HR 100-110, audible wheezes noted.

## 2020-09-01 NOTE — ED Provider Notes (Signed)
[facility] EMERGENCY DEPARTMENT Provider Note   CSN: [id number] Arrival date & time: 09/01/20  0913     History Chief Complaint  Patient presents with  . Shortness of Breath    Darlene Logan is a 45 y.o. female.  HPI      45 year old female with history of asthma, eczema, seasonal allergies presents with concern for cough and wheezing.  Reports that she woke up this morning at 8 AM increased cough and wheezing.  Cough is nonproductive.  Wheezing feels like her asthma.  Feels like she is having an asthma exacerbation.  She ran out of her inhaler as she had to use it more recently.  She only takes albuterol for her asthma.  Denies fevers, nausea, vomiting, sore throat, congestion, leg pain or swelling.  Past Medical History:  Diagnosis Date  . Anemia   . Asthma   . Eczema   . Obesity   . Seasonal allergies     There are no problems to display for this patient.   Past Surgical History:  Procedure Laterality Date  . NO PAST SURGERIES       OB History   No obstetric history on file.     Family History  Problem Relation Age of Onset  . Hypertension Maternal Aunt   . HIV/AIDS Mother   . Diabetes Sister   . Diabetes Maternal Aunt     Social History   Tobacco Use  . Smoking status: Former Smoker    Types: Cigarettes    Quit date: 07/12/2007    Years since quitting: 13.1  . Smokeless tobacco: Never Used  Vaping Use  . Vaping Use: Never used  Substance Use Topics  . Alcohol use: Yes    Comment: occasional  . Drug use: Never    Home Medications Prior to Admission medications   Medication Sig Start Date End Date Taking? Authorizing Provider  albuterol (PROVENTIL) (2.5 MG/3ML) 0.083% nebulizer solution Take 3 mLs (2.5 mg total) by nebulization every 6 (six) hours as needed for wheezing or shortness of breath. 09/01/20  Yes Alvira Monday, MD  albuterol (VENTOLIN HFA) 108 (90 Base) MCG/ACT inhaler Inhale into the lungs every 6 (six) hours as needed for  wheezing or shortness of breath.   Yes [provider]  cetirizine (ZYRTEC ALLERGY) 10 MG tablet Take 1 tablet (10 mg total) by mouth daily. 08/10/19  Yes Bast, Traci A, NP  famotidine (PEPCID) 20 MG tablet Take 1 tablet (20 mg total) by mouth 2 (two) times daily. 06/20/20  Yes Wallis Bamberg, PA-C  predniSONE (DELTASONE) 10 MG tablet Take 4 tablets (40 mg total) by mouth daily for 4 days. 09/01/20 09/05/20 Yes Guiselle Mian, Denny Peon, MD  ADVAIR DISKUS 250-50 MCG/DOSE AEPB Inhale 1 puff into the lungs 2 (two) times daily. 06/11/20   [provider]  ciprofloxacin (CIPRO) 500 MG tablet Take 1 tablet by mouth every 12 (twelve) hours. 07/09/20   [provider]  dicyclomine (BENTYL) 20 MG tablet Take 20 mg by mouth 4 (four) times daily. 07/09/20   [provider]  ferrous sulfate 325 (65 FE) MG tablet Take 325 mg by mouth daily with breakfast.    [provider]  fluticasone (FLONASE) 50 MCG/ACT nasal spray Place 2 sprays into both nostrils daily. 12/07/18   Joy, Shawn C, PA-C  methocarbamol (ROBAXIN) 500 MG tablet Take 1 tablet (500 mg total) by mouth 2 (two) times daily. 08/13/20   Dartha Lodge, PA-C  naproxen (NAPROSYN) 500 MG  tablet Take 1 tablet (500 mg total) by mouth 2 (two) times daily. 08/13/20   Dartha Lodge, PA-C  omeprazole (PRILOSEC) 20 MG capsule Take 1 capsule (20 mg total) by mouth daily. 06/20/20   Wallis Bamberg, PA-C  triamcinolone cream (KENALOG) 0.1 % Apply topically 2 (two) times daily. 09/23/19   Riki Sheer, PA-C    Allergies    Claritin [loratadine]; Other; Pecan nut (diagnostic); Shellfish allergy; Albumen, egg; and Coconut fatty acids  Review of Systems   Review of Systems  Constitutional: Negative for fever.  HENT: Negative for congestion and sore throat.   Respiratory: Positive for cough and shortness of breath.   Cardiovascular: Negative for chest pain.  Gastrointestinal: Negative for abdominal pain, nausea and vomiting.   Musculoskeletal: Negative for back pain.  Skin: Negative for rash.  Neurological: Negative for syncope and headaches.    Physical Exam Updated Vital Signs BP 123/75   Pulse 92   Temp 98.6 F (37 C) (Oral)   Resp 20   Ht 5\' 5"  (1.651 m)   Wt 89.8 kg   LMP 08/31/2020 (Exact Date)   SpO2 98%   BMI 32.95 kg/m   Physical Exam Vitals and nursing note reviewed.  Constitutional:      General: She is not in acute distress.    Appearance: She is well-developed. She is not diaphoretic.  HENT:     Head: Normocephalic and atraumatic.  Eyes:     Conjunctiva/sclera: Conjunctivae normal.  Cardiovascular:     Rate and Rhythm: Normal rate and regular rhythm.     Heart sounds: Normal heart sounds. No murmur heard. No friction rub. No gallop.   Pulmonary:     Effort: Pulmonary effort is normal. No respiratory distress.     Breath sounds: Wheezing (tightness/diminshed, occ end exp wheeze) present. No rales.  Abdominal:     General: There is no distension.     Palpations: Abdomen is soft.     Tenderness: There is no abdominal tenderness. There is no guarding.  Musculoskeletal:        General: No tenderness.     Cervical back: Normal range of motion.  Skin:    General: Skin is warm and dry.     Findings: No erythema or rash.  Neurological:     Mental Status: She is alert and oriented to person, place, and time.     ED Results / Procedures / Treatments   Labs (all labs ordered are listed, but only abnormal results are displayed) Labs Reviewed  SARS CORONAVIRUS 2 (TAT 6-24 HRS)    EKG None  Radiology No results found.  Procedures Procedures   Medications Ordered in ED Medications  ipratropium-albuterol (DUONEB) 0.5-2.5 (3) MG/3ML nebulizer solution 3 mL (3 mLs Nebulization Given 09/01/20 0938)  albuterol (PROVENTIL) (2.5 MG/3ML) 0.083% nebulizer solution 2.5 mg (2.5 mg Nebulization Given 09/01/20 0938)  predniSONE (DELTASONE) tablet 60 mg (60 mg Oral Given 09/01/20 0951)   albuterol (VENTOLIN HFA) 108 (90 Base) MCG/ACT inhaler 2 puff (2 puffs Inhalation Given 09/01/20 1006)    ED Course  I have reviewed the triage vital signs and the nursing notes.  Pertinent labs & imaging results that were available during my care of the patient were reviewed by me and considered in my medical decision making (see chart for details).    MDM Rules/Calculators/A&P  45 year old female with history of asthma, eczema, seasonal allergies presents with concern for cough and wheezing.  No focal findings on lung exam to suggest pneumonia, and history of physical exam are also not consistent with pneumothorax.  Low suspicion for pulmonary embolus.  History and exam most consistent with asthma exacerbation.  Given nebulizer, prednisone 60 mg, and albuterol inhaler in the emergency department with improvement.  Given prescription for prednisone for the next 4 days.  Given cough, ordered 6 a 24-hour COVID test which is pending.   Final Clinical Impression(s) / ED Diagnoses Final diagnoses:  Mild intermittent asthma with exacerbation    Rx / DC Orders ED Discharge Orders         Ordered    predniSONE (DELTASONE) 10 MG tablet  Daily        09/01/20 0941    albuterol (PROVENTIL) (2.5 MG/3ML) 0.083% nebulizer solution  Every 6 hours PRN        09/01/20 1028           Alvira Monday, MD 09/01/20 1111

## 2020-09-01 NOTE — ED Notes (Signed)
Pt discharged to home. Discharge instructions have been discussed with patient and/or family members. Pt verbally acknowledges understanding d/c instructions, and endorses comprehension to checkout at registration before leaving.  °

## 2020-09-01 NOTE — ED Triage Notes (Signed)
SOB with audible wheezing x 1 hour despite rescue inhaler use.  Pt states she has needed her rescue inhaler more recently for the last month.  Denies recent illness.

## 2020-09-02 LAB — SARS CORONAVIRUS 2 (TAT 6-24 HRS): SARS Coronavirus 2: NEGATIVE

## 2020-09-27 ENCOUNTER — Ambulatory Visit: Payer: 59

## 2020-10-03 ENCOUNTER — Telehealth: Payer: Self-pay

## 2020-10-03 NOTE — Telephone Encounter (Signed)
Please charge her for late [facility] cancellation.

## 2020-10-03 NOTE — Telephone Encounter (Signed)
Hi, Dr. Russella Dar,  I called pt to remind her of her appt on July 8th. She stated that she forgot, and did not start prep today. Pt will call office back to reschedule.

## 2020-10-04 ENCOUNTER — Encounter: Payer: 59 | Admitting: Gastroenterology

## 2020-12-06 ENCOUNTER — Other Ambulatory Visit: Payer: Self-pay

## 2020-12-06 ENCOUNTER — Emergency Department (HOSPITAL_BASED_OUTPATIENT_CLINIC_OR_DEPARTMENT_OTHER)
Admission: EM | Admit: 2020-12-06 | Discharge: 2020-12-06 | Disposition: A | Discharge status: Home / Self Care | Payer: 59 | Attending: Emergency Medicine | Admitting: Emergency Medicine

## 2020-12-06 ENCOUNTER — Encounter (HOSPITAL_BASED_OUTPATIENT_CLINIC_OR_DEPARTMENT_OTHER): Payer: Self-pay | Admitting: *Deleted

## 2020-12-06 DIAGNOSIS — Z87891 Personal history of nicotine dependence: Secondary | ICD-10-CM | POA: Diagnosis not present

## 2020-12-06 DIAGNOSIS — J45909 Unspecified asthma, uncomplicated: Secondary | ICD-10-CM | POA: Insufficient documentation

## 2020-12-06 DIAGNOSIS — Z7951 Long term (current) use of inhaled steroids: Secondary | ICD-10-CM | POA: Insufficient documentation

## 2020-12-06 DIAGNOSIS — R21 Rash and other nonspecific skin eruption: Secondary | ICD-10-CM | POA: Diagnosis present

## 2020-12-06 MED ORDER — PREDNISONE 10 MG (21) PO TBPK: ORAL_TABLET | Freq: Every day | ORAL | 0 refills | Status: DC

## 2020-12-06 MED ORDER — TRIAMCINOLONE ACETONIDE 0.1 % EX CREA: 1.0000 "application " | TOPICAL_CREAM | Freq: Two times a day (BID) | CUTANEOUS | 0 refills | Status: AC

## 2020-12-06 NOTE — ED Provider Notes (Signed)
[facility] EMERGENCY DEPARTMENT Provider Note   CSN: [id number] Arrival date & time: 12/06/20  1745     History Chief Complaint  Patient presents with   Rash    Darlene Logan is a 45 y.o. female presenting to the ED with a chief complaint of rash.  Over the past few days has noticed that her eczema has flared up.  It is present in her bilateral ACs, torso and lower back.  She unfortunately ran out of refills on her triamcinolone cream and the tube that she had was expired.  This does feel similar to her prior eczema flareups but because it has been going on for so long she is unable to control the itching.  Has not taking any medications to help with symptoms.  Denies any lip or tongue swelling, sick contacts with similar symptoms, new environmental exposures.   Rash Associated symptoms: no fever       Past Medical History:  Diagnosis Date   Anemia    Asthma    Eczema    Obesity    Seasonal allergies     There are no problems to display for this patient.   Past Surgical History:  Procedure Laterality Date   NO PAST SURGERIES       OB History   No obstetric history on file.     Family History  Problem Relation Age of Onset   Hypertension Maternal Aunt    HIV/AIDS Mother    Diabetes Sister    Diabetes Maternal Aunt     Social History   Tobacco Use   Smoking status: Former    Types: Cigarettes    Quit date: 07/12/2007    Years since quitting: 13.4   Smokeless tobacco: Never  Vaping Use   Vaping Use: Never used  Substance Use Topics   Alcohol use: Yes    Comment: occasional   Drug use: Never    Home Medications Prior to Admission medications   Medication Sig Start Date End Date Taking? Authorizing Provider  predniSONE (STERAPRED UNI-PAK 21 TAB) 10 MG (21) TBPK tablet Take by mouth daily. Take 6 tabs by mouth daily  for 2 days, then 5 tabs for 2 days, then 4 tabs for 2 days, then 3 tabs for 2 days, 2 tabs for 2 days, then 1 tab by mouth  daily for 2 days 12/06/20  Yes Ova Meegan, PA-C  triamcinolone cream (KENALOG) 0.1 % Apply 1 application topically 2 (two) times daily. 12/06/20  Yes Mong Neal, PA-C  ADVAIR DISKUS 250-50 MCG/DOSE AEPB Inhale 1 puff into the lungs 2 (two) times daily. 06/11/20   [provider]  albuterol (PROVENTIL) (2.5 MG/3ML) 0.083% nebulizer solution Take 3 mLs (2.5 mg total) by nebulization every 6 (six) hours as needed for wheezing or shortness of breath. 09/01/20   Alvira Monday, MD  albuterol (VENTOLIN HFA) 108 (90 Base) MCG/ACT inhaler Inhale into the lungs every 6 (six) hours as needed for wheezing or shortness of breath.    [provider]  cetirizine (ZYRTEC ALLERGY) 10 MG tablet Take 1 tablet (10 mg total) by mouth daily. 08/10/19   Dahlia Byes A, NP  ciprofloxacin (CIPRO) 500 MG tablet Take 1 tablet by mouth every 12 (twelve) hours. 07/09/20   [provider]  dicyclomine (BENTYL) 20 MG tablet Take 20 mg by mouth 4 (four) times daily. 07/09/20   [provider]  famotidine (PEPCID) 20 MG tablet Take 1 tablet (20 mg total) by mouth 2 (  two) times daily. 06/20/20   Wallis Bamberg, PA-C  ferrous sulfate 325 (65 FE) MG tablet Take 325 mg by mouth daily with breakfast.    [provider]  fluticasone (FLONASE) 50 MCG/ACT nasal spray Place 2 sprays into both nostrils daily. 12/07/18   Joy, Shawn C, PA-C  methocarbamol (ROBAXIN) 500 MG tablet Take 1 tablet (500 mg total) by mouth 2 (two) times daily. 08/13/20   Dartha Lodge, PA-C  naproxen (NAPROSYN) 500 MG tablet Take 1 tablet (500 mg total) by mouth 2 (two) times daily. 08/13/20   Dartha Lodge, PA-C  omeprazole (PRILOSEC) 20 MG capsule Take 1 capsule (20 mg total) by mouth daily. 06/20/20   Wallis Bamberg, PA-C    Allergies    Claritin [loratadine]; Other; Pecan nut (diagnostic); Shellfish allergy; Albumen, egg; and Coconut fatty acids  Review of Systems   Review of Systems  Constitutional:  Negative for fever.  HENT:   Negative for facial swelling.   Skin:  Positive for rash.   Physical Exam Updated Vital Signs BP 128/74   Pulse 95   Temp 98.8 F (37.1 C) (Oral)   Resp 16   Ht 5\' 5"  (1.651 m)   Wt 91.2 kg   SpO2 99%   BMI 33.45 kg/m   Physical Exam Vitals and nursing note reviewed.  Constitutional:      General: She is not in acute distress.    Appearance: She is well-developed. She is not diaphoretic.  HENT:     Head: Normocephalic and atraumatic.  Eyes:     General: No scleral icterus.    Conjunctiva/sclera: Conjunctivae normal.  Pulmonary:     Effort: Pulmonary effort is normal. No respiratory distress.  Musculoskeletal:     Cervical back: Normal range of motion.  Skin:    Findings: Rash (Eczematous rash noted to bilateral ACs and torso) present.  Neurological:     Mental Status: She is alert.    ED Results / Procedures / Treatments   Labs (all labs ordered are listed, but only abnormal results are displayed) Labs Reviewed - No data to display  EKG None  Radiology No results found.  Procedures Procedures   Medications Ordered in ED Medications - No data to display  ED Course  I have reviewed the triage vital signs and the nursing notes.  Pertinent labs & imaging results that were available during my care of the patient were reviewed by me and considered in my medical decision making (see chart for details).    MDM Rules/Calculators/A&P                           Pt has a patent airway without stridor and is handling secretions without difficulty; no angioedema. No blisters, no pustules, no warmth, no draining sinus tracts, no superficial abscesses, no bullous impetigo, no vesicles, no desquamation, no target lesions with dusky purpura or a central bulla. Not tender to touch. No concern for superimposed infection. No concern for SJS, TEN, TSS, tick borne illness, syphilis or other life-threatening condition.  Suspect that symptoms are due to eczema.  Will treat with  steroids.  Return precautions given.    Patient is hemodynamically stable, in NAD, and able to ambulate in the ED. Evaluation does not show pathology that would require ongoing emergent intervention or inpatient treatment. I explained the diagnosis to the patient. Pain has been managed and has no complaints prior to discharge. Patient is comfortable with  above plan and is stable for discharge at this time. All questions were answered prior to disposition. Strict return precautions for returning to the ED were discussed. Encouraged follow up with PCP.   An After Visit Summary was printed and given to the patient.   Portions of this note were generated with Scientist, clinical (histocompatibility and immunogenetics). Dictation errors may occur despite best attempts at proofreading.  Final Clinical Impression(s) / ED Diagnoses Final diagnoses:  Rash and nonspecific skin eruption    Rx / DC Orders ED Discharge Orders          Ordered    predniSONE (STERAPRED UNI-PAK 21 TAB) 10 MG (21) TBPK tablet  Daily        12/06/20 1908    triamcinolone cream (KENALOG) 0.1 %  2 times daily        12/06/20 1908             Dietrich Pates, PA-C 12/06/20 1910    Rolan Bucco, MD 12/06/20 2312

## 2020-12-06 NOTE — ED Triage Notes (Signed)
C/o increased eczema x 2 weeks

## 2020-12-06 NOTE — Discharge Instructions (Addendum)
Take the steroids as directed. Follow-up with your primary care provider. Return to the ER for worsening rash, if you develop a fever, lip or tongue swelling. You can take antihistamines help with itching as well.

## 2020-12-06 NOTE — ED Notes (Signed)
D/c paperwork reviewed with pt, including prescriptions.  Pt with no questions or concerns at time of d/c. Ambulatory to ED exit.  

## 2021-01-04 ENCOUNTER — Emergency Department (HOSPITAL_BASED_OUTPATIENT_CLINIC_OR_DEPARTMENT_OTHER)
Admission: EM | Admit: 2021-01-04 | Discharge: 2021-01-04 | Disposition: A | Discharge status: Home / Self Care | Payer: 59 | Attending: Emergency Medicine | Admitting: Emergency Medicine

## 2021-01-04 ENCOUNTER — Other Ambulatory Visit: Payer: Self-pay

## 2021-01-04 ENCOUNTER — Encounter (HOSPITAL_BASED_OUTPATIENT_CLINIC_OR_DEPARTMENT_OTHER): Payer: Self-pay | Admitting: *Deleted

## 2021-01-04 ENCOUNTER — Emergency Department (HOSPITAL_BASED_OUTPATIENT_CLINIC_OR_DEPARTMENT_OTHER): Payer: 59

## 2021-01-04 DIAGNOSIS — Z7952 Long term (current) use of systemic steroids: Secondary | ICD-10-CM | POA: Diagnosis not present

## 2021-01-04 DIAGNOSIS — U071 COVID-19: Secondary | ICD-10-CM | POA: Diagnosis not present

## 2021-01-04 DIAGNOSIS — J45909 Unspecified asthma, uncomplicated: Secondary | ICD-10-CM | POA: Insufficient documentation

## 2021-01-04 DIAGNOSIS — J069 Acute upper respiratory infection, unspecified: Secondary | ICD-10-CM | POA: Insufficient documentation

## 2021-01-04 DIAGNOSIS — R051 Acute cough: Secondary | ICD-10-CM

## 2021-01-04 DIAGNOSIS — Z28310 Unvaccinated for covid-19: Secondary | ICD-10-CM | POA: Diagnosis not present

## 2021-01-04 DIAGNOSIS — Z87891 Personal history of nicotine dependence: Secondary | ICD-10-CM | POA: Insufficient documentation

## 2021-01-04 DIAGNOSIS — R059 Cough, unspecified: Secondary | ICD-10-CM | POA: Diagnosis present

## 2021-01-04 DIAGNOSIS — Z91010 Allergy to peanuts: Secondary | ICD-10-CM | POA: Insufficient documentation

## 2021-01-04 LAB — RESP PANEL BY RT-PCR (FLU A&B, COVID) ARPGX2
Influenza A by PCR: NEGATIVE
Influenza B by PCR: NEGATIVE
SARS Coronavirus 2 by RT PCR: POSITIVE — AB

## 2021-01-04 MED ORDER — ALBUTEROL SULFATE HFA 108 (90 BASE) MCG/ACT IN AERS: 2.0000 | INHALATION_SPRAY | RESPIRATORY_TRACT | 0 refills | Status: DC | PRN

## 2021-01-04 MED ORDER — ALBUTEROL SULFATE HFA 108 (90 BASE) MCG/ACT IN AERS
2.0000 | INHALATION_SPRAY | Freq: Once | RESPIRATORY_TRACT | Status: AC
  Administered 2021-01-04: 2 via RESPIRATORY_TRACT
  Filled 2021-01-04: qty 6.7

## 2021-01-04 MED ORDER — AEROCHAMBER PLUS FLO-VU LARGE MISC
1.0000 | Freq: Once | Status: AC
  Administered 2021-01-04: 1
  Filled 2021-01-04: qty 1

## 2021-01-04 NOTE — ED Triage Notes (Signed)
Fatigue, cough, body aches, subjective fever x 2 days

## 2021-01-04 NOTE — ED Provider Notes (Signed)
[facility] EMERGENCY DEPARTMENT Provider Note   CSN: [id number] Arrival date & time: 01/04/21  1859     History Chief Complaint  Patient presents with   Cough    Darlene Logan is a 45 y.o. female with past medical history of asthma, eczema, seasonal allergies, who presents today for evaluation of fatigue, cough, body aches and subjective fever for 2 days.  She reports nasal congestion.  Her cough is nonproductive however is worse at night.  She is out of her albuterol and feels like she is wheezing at night.  She is not vaccinated against COVID, did have COVID a year ago and states that this feels similar however not as severe.    She denies any known sick contacts.  No significant chest pain or shortness of breath.  No leg swelling.  HPI     Past Medical History:  Diagnosis Date   Anemia    Asthma    Eczema    Obesity    Seasonal allergies     There are no problems to display for this patient.   Past Surgical History:  Procedure Laterality Date   NO PAST SURGERIES       OB History   No obstetric history on file.     Family History  Problem Relation Age of Onset   Hypertension Maternal Aunt    HIV/AIDS Mother    Diabetes Sister    Diabetes Maternal Aunt     Social History   Tobacco Use   Smoking status: Former    Types: Cigarettes    Quit date: 07/12/2007    Years since quitting: 13.4   Smokeless tobacco: Never  Vaping Use   Vaping Use: Never used  Substance Use Topics   Alcohol use: Yes    Comment: occasional   Drug use: Never    Home Medications Prior to Admission medications   Medication Sig Start Date End Date Taking? Authorizing Provider  albuterol (VENTOLIN HFA) 108 (90 Base) MCG/ACT inhaler Inhale 2 puffs into the lungs every 4 (four) hours as needed for wheezing or shortness of breath. 01/04/21  Yes Cristina Gong, PA-C  ADVAIR DISKUS 250-50 MCG/DOSE AEPB Inhale 1 puff into the lungs 2 (two) times daily. 06/11/20    [provider]  cetirizine (ZYRTEC ALLERGY) 10 MG tablet Take 1 tablet (10 mg total) by mouth daily. 08/10/19   Dahlia Byes A, NP  ciprofloxacin (CIPRO) 500 MG tablet Take 1 tablet by mouth every 12 (twelve) hours. 07/09/20   [provider]  dicyclomine (BENTYL) 20 MG tablet Take 20 mg by mouth 4 (four) times daily. 07/09/20   [provider]  famotidine (PEPCID) 20 MG tablet Take 1 tablet (20 mg total) by mouth 2 (two) times daily. 06/20/20   Wallis Bamberg, PA-C  ferrous sulfate 325 (65 FE) MG tablet Take 325 mg by mouth daily with breakfast.    [provider]  fluticasone (FLONASE) 50 MCG/ACT nasal spray Place 2 sprays into both nostrils daily. 12/07/18   Joy, Shawn C, PA-C  methocarbamol (ROBAXIN) 500 MG tablet Take 1 tablet (500 mg total) by mouth 2 (two) times daily. 08/13/20   Dartha Lodge, PA-C  naproxen (NAPROSYN) 500 MG tablet Take 1 tablet (500 mg total) by mouth 2 (two) times daily. 08/13/20   Dartha Lodge, PA-C  omeprazole (PRILOSEC) 20 MG capsule Take 1 capsule (20 mg total) by mouth daily. 06/20/20   Wallis Bamberg, PA-C  predniSONE (STERAPRED UNI-PAK 21  TAB) 10 MG (21) TBPK tablet Take by mouth daily. Take 6 tabs by mouth daily  for 2 days, then 5 tabs for 2 days, then 4 tabs for 2 days, then 3 tabs for 2 days, 2 tabs for 2 days, then 1 tab by mouth daily for 2 days 12/06/20   Idelle Leech, Hina, PA-C  triamcinolone cream (KENALOG) 0.1 % Apply 1 application topically 2 (two) times daily. 12/06/20   Khatri, Hina, PA-C    Allergies    Claritin [loratadine]; Other; Pecan nut (diagnostic); Shellfish allergy; Albumen, egg; and Coconut fatty acids  Review of Systems   Review of Systems  Constitutional:  Positive for chills and fatigue.  HENT:  Positive for congestion, ear pain, postnasal drip, rhinorrhea and sinus pain. Negative for sinus pressure, sore throat, trouble swallowing and voice change.   Eyes:  Negative for visual disturbance.  Respiratory:  Positive for  cough and wheezing. Negative for chest tightness and shortness of breath.   Cardiovascular:  Negative for chest pain, palpitations and leg swelling.  Genitourinary:  Negative for dysuria.  Musculoskeletal:  Negative for neck pain and neck stiffness.  Neurological:  Negative for weakness, numbness and headaches.   Physical Exam Updated Vital Signs BP (!) 144/113 (BP Location: Left Arm)   Pulse 98   Temp 98.8 F (37.1 C) (Oral)   Resp 20   Ht 5\' 5"  (1.651 m)   Wt 91.2 kg   LMP 11/16/2020 (Approximate)   SpO2 100%   BMI 33.45 kg/m   Physical Exam Vitals and nursing note reviewed.  Constitutional:      General: She is not in acute distress.    Appearance: Normal appearance. She is well-developed. She is not ill-appearing or diaphoretic.  HENT:     Head: Normocephalic and atraumatic.     Right Ear: Tympanic membrane, ear canal and external ear normal.     Left Ear: Tympanic membrane, ear canal and external ear normal.     Nose: Mucosal edema, congestion and rhinorrhea present.     Mouth/Throat:     Mouth: Mucous membranes are moist.     Pharynx: Oropharynx is clear. Uvula midline. No oropharyngeal exudate or posterior oropharyngeal erythema.     Tonsils: No tonsillar exudate.  Eyes:     General: No scleral icterus.    Conjunctiva/sclera: Conjunctivae normal.  Cardiovascular:     Rate and Rhythm: Normal rate and regular rhythm.     Pulses: Normal pulses.     Heart sounds: Normal heart sounds.  Pulmonary:     Effort: Pulmonary effort is normal. No respiratory distress.     Breath sounds: Normal breath sounds. No wheezing.     Comments: Occasional cough Musculoskeletal:     Cervical back: Normal range of motion and neck supple.  Lymphadenopathy:     Cervical: No cervical adenopathy.  Skin:    General: Skin is warm and dry.  Neurological:     Mental Status: She is alert.     Comments: Patient is awake and alert.  Answers questions appropriately without difficulty.   Psychiatric:        Mood and Affect: Mood normal.        Behavior: Behavior normal.    ED Results / Procedures / Treatments   Labs (all labs ordered are listed, but only abnormal results are displayed) Labs Reviewed  RESP PANEL BY RT-PCR (FLU A&B, COVID) ARPGX2 - Abnormal; Notable for the following components:      Result Value  SARS Coronavirus 2 by RT PCR POSITIVE (*)    All other components within normal limits    EKG None  Radiology DG Chest 2 View  Result Date: 01/04/2021 CLINICAL DATA:  Cough and fever. Fatigue, cough, body aches, fever for 2 days. EXAM: CHEST - 2 VIEW COMPARISON:  08/13/2020 FINDINGS: The heart size and mediastinal contours are within normal limits. Both lungs are clear. The visualized skeletal structures are unremarkable. IMPRESSION: No active cardiopulmonary disease. Electronically Signed   By: Burman Nieves M.D.   On: 01/04/2021 22:21    Procedures Procedures   Medications Ordered in ED Medications  albuterol (VENTOLIN HFA) 108 (90 Base) MCG/ACT inhaler 2 puff (2 puffs Inhalation Given 01/04/21 2304)  AeroChamber Plus Flo-Vu Large MISC 1 each (1 each Other Given 01/04/21 2304)    ED Course  I have reviewed the triage vital signs and the nursing notes.  Pertinent labs & imaging results that were available during my care of the patient were reviewed by me and considered in my medical decision making (see chart for details).    MDM Rules/Calculators/A&P                         Darlene Logan was evaluated in Emergency Department on 01/05/2021 for the symptoms described in the history of present illness. She was evaluated in the context of the global COVID-19 pandemic, which necessitated consideration that the patient might be at risk for infection with the SARS-CoV-2 virus that causes COVID-19. Institutional protocols and algorithms that pertain to the evaluation of patients at risk for COVID-19 are in a state of rapid change based on information  released by regulatory bodies including the CDC and federal and state organizations. These policies and algorithms were followed during the patient's care in the ED.  Patient is a 45 year old woman who presents today for evaluation of 2 days of fever, subjective, cough, body aches, fatigue, nasal congestion.  She feels like she has been wheezing has a history of asthma and does not have an inhaler at home. Her lungs are clear to auscultation bilaterally, chest x-ray without pneumonia, pneumothorax or other acute significant abnormalities.  She is 100% on room air.   She is given a inhaler and a spacer while in the emergency room and a prescription for additional albuterol.  Clinically suspect she has a viral URI.  She has not had symptoms for more than 10 days and has not had a double sickening pattern and therefore does not appear to require antibiotics at this time.  Flu and COVID testing is sent, recommended that she follow these results in MyChart.  Return precautions were discussed with patient who states their understanding.  At the time of discharge patient denied any unaddressed complaints or concerns.  Patient is agreeable for discharge home.  Note: Portions of this report may have been transcribed using voice recognition software. Every effort was made to ensure accuracy; however, inadvertent computerized transcription errors may be present    Final Clinical Impression(s) / ED Diagnoses Final diagnoses:  Acute cough  Upper respiratory tract infection, unspecified type    Rx / DC Orders ED Discharge Orders          Ordered    albuterol (VENTOLIN HFA) 108 (90 Base) MCG/ACT inhaler  Every 4 hours PRN        01/04/21 2331             Cristina Gong, PA-C 01/05/21 0004  Charlynne Pander, MD 01/05/21 [phone]

## 2021-01-04 NOTE — Discharge Instructions (Addendum)
As we discussed today I would recommend that you do a sinus rinse and take a warm shower before bed to help decrease the amount of drainage you are having. You have COVID and flu test pending.  You can follow these in MyChart. If your symptoms worsen, you develop constant shortness of breath, chest pain or have other concerns please seek additional medical care and evaluation.

## 2021-04-15 ENCOUNTER — Other Ambulatory Visit: Payer: Self-pay

## 2021-04-15 ENCOUNTER — Emergency Department (HOSPITAL_BASED_OUTPATIENT_CLINIC_OR_DEPARTMENT_OTHER)
Admission: EM | Admit: 2021-04-15 | Discharge: 2021-04-15 | Disposition: A | Discharge status: Home / Self Care | Payer: 59 | Attending: Emergency Medicine | Admitting: Emergency Medicine

## 2021-04-15 ENCOUNTER — Encounter (HOSPITAL_BASED_OUTPATIENT_CLINIC_OR_DEPARTMENT_OTHER): Payer: Self-pay | Admitting: *Deleted

## 2021-04-15 DIAGNOSIS — Z91010 Allergy to peanuts: Secondary | ICD-10-CM | POA: Insufficient documentation

## 2021-04-15 DIAGNOSIS — Z7951 Long term (current) use of inhaled steroids: Secondary | ICD-10-CM | POA: Insufficient documentation

## 2021-04-15 DIAGNOSIS — J45909 Unspecified asthma, uncomplicated: Secondary | ICD-10-CM | POA: Diagnosis not present

## 2021-04-15 DIAGNOSIS — Z760 Encounter for issue of repeat prescription: Secondary | ICD-10-CM | POA: Diagnosis present

## 2021-04-15 DIAGNOSIS — Z79899 Other long term (current) drug therapy: Secondary | ICD-10-CM | POA: Diagnosis not present

## 2021-04-15 MED ORDER — ALBUTEROL SULFATE HFA 108 (90 BASE) MCG/ACT IN AERS
INHALATION_SPRAY | RESPIRATORY_TRACT | Status: AC
  Administered 2021-04-15: 2
  Filled 2021-04-15: qty 6.7

## 2021-04-15 MED ORDER — ALBUTEROL SULFATE HFA 108 (90 BASE) MCG/ACT IN AERS: 2.0000 | INHALATION_SPRAY | RESPIRATORY_TRACT | 0 refills | Status: DC | PRN

## 2021-04-15 MED ORDER — DEXAMETHASONE SODIUM PHOSPHATE 10 MG/ML IJ SOLN
10.0000 mg | Freq: Once | INTRAMUSCULAR | Status: AC
  Administered 2021-04-15: 10 mg via INTRAMUSCULAR
  Filled 2021-04-15: qty 1

## 2021-04-15 NOTE — ED Provider Notes (Signed)
[place] [facility] EMERGENCY DEPARTMENT Provider Note   CSN: [id number] Arrival date & time: 04/15/21  1406     History  Chief Complaint  Patient presents with   Medication Refill    Darlene Logan is a 46 y.o. female past medical history of asthma presenting today with a request for a refill of her inhaler.  Over the past 2 to 3 days she has been using her albuterol inhaler more frequently.  Occasional dry cough however no fever, chills or other URI symptoms.  Says that this is consistent with her previous asthma problems.  Primary care provider sent a refill of her inhaler to the pharmacy however her insurance will not cover the brand name.  Denies any recent travel, surgery, OCP use, history of DVT/PE.   Medication Refill     Home Medications Prior to Admission medications   Medication Sig Start Date End Date Taking? Authorizing Provider  ADVAIR DISKUS 250-50 MCG/DOSE AEPB Inhale 1 puff into the lungs 2 (two) times daily. 06/11/20   [provider]  albuterol (VENTOLIN HFA) 108 (90 Base) MCG/ACT inhaler Inhale 2 puffs into the lungs every 4 (four) hours as needed for wheezing or shortness of breath. 04/15/21   Byanca Kasper A, PA-C  cetirizine (ZYRTEC ALLERGY) 10 MG tablet Take 1 tablet (10 mg total) by mouth daily. 08/10/19   Loura Halt A, NP  ciprofloxacin (CIPRO) 500 MG tablet Take 1 tablet by mouth every 12 (twelve) hours. 07/09/20   [provider]  dicyclomine (BENTYL) 20 MG tablet Take 20 mg by mouth 4 (four) times daily. 07/09/20   [provider]  famotidine (PEPCID) 20 MG tablet Take 1 tablet (20 mg total) by mouth 2 (two) times daily. 06/20/20   Jaynee Eagles, PA-C  ferrous sulfate 325 (65 FE) MG tablet Take 325 mg by mouth daily with breakfast.    [provider]  fluticasone (FLONASE) 50 MCG/ACT nasal spray Place 2 sprays into both nostrils daily. 12/07/18   Joy, Shawn C, PA-C  methocarbamol (ROBAXIN) 500 MG tablet Take 1 tablet (500  mg total) by mouth 2 (two) times daily. 08/13/20   Jacqlyn Larsen, PA-C  naproxen (NAPROSYN) 500 MG tablet Take 1 tablet (500 mg total) by mouth 2 (two) times daily. 08/13/20   Jacqlyn Larsen, PA-C  omeprazole (PRILOSEC) 20 MG capsule Take 1 capsule (20 mg total) by mouth daily. 06/20/20   Jaynee Eagles, PA-C  predniSONE (STERAPRED UNI-PAK 21 TAB) 10 MG (21) TBPK tablet Take by mouth daily. Take 6 tabs by mouth daily  for 2 days, then 5 tabs for 2 days, then 4 tabs for 2 days, then 3 tabs for 2 days, 2 tabs for 2 days, then 1 tab by mouth daily for 2 days 12/06/20   Shelly Coss, Hina, PA-C  triamcinolone cream (KENALOG) 0.1 % Apply 1 application topically 2 (two) times daily. 12/06/20   Khatri, Hina, PA-C      Allergies    Claritin [loratadine], Other, Pecan nut (diagnostic), Shellfish allergy, Coconut fatty acids, and Egg white (egg protein)    Review of Systems   Review of Systems  Constitutional:  Negative for chills and fever.  Respiratory:  Positive for cough and shortness of breath.   Cardiovascular:  Negative for leg swelling.   Physical Exam Updated Vital Signs BP 135/75 (BP Location: Right Arm)    Pulse (!) 112    Temp 98.1 F (36.7 C) (Oral)    Resp (!) 22  Ht 5\' 5"  (1.651 m)    Wt 89.4 kg    LMP 03/30/2021    SpO2 100%    BMI 32.78 kg/m  Physical Exam Vitals and nursing note reviewed.  Constitutional:      General: She is not in acute distress.    Appearance: Normal appearance. She is not ill-appearing.  HENT:     Head: Normocephalic and atraumatic.  Eyes:     General: No scleral icterus.    Conjunctiva/sclera: Conjunctivae normal.  Cardiovascular:     Rate and Rhythm: Normal rate and regular rhythm.  Pulmonary:     Effort: Pulmonary effort is normal. No respiratory distress.     Breath sounds: No wheezing or rales.  Skin:    Findings: No rash.  Neurological:     Mental Status: She is alert.  Psychiatric:        Mood and Affect: Mood normal.    ED Results / Procedures /  Treatments   Labs (all labs ordered are listed, but only abnormal results are displayed) Labs Reviewed - No data to display  EKG None  Radiology No results found.  Procedures Procedures    Medications Ordered in ED Medications  dexamethasone (DECADRON) injection 10 mg (has no administration in time range)  albuterol (VENTOLIN HFA) 108 (90 Base) MCG/ACT inhaler (2 puffs  Given 04/15/21 1415)    ED Course/ Medical Decision Making/ A&P                           Medical Decision Making Risk Prescription drug management.   46 year old female with a history of asthma presenting today with a request for an albuterol inhaler.  Primary care sent one to the pharmacy however her insurance would not cover it because he wrote the brand name.  She is unable to get in touch with him at this time.  Reports increased inhaler use over the past few days.  She reports that it is consistent with her usual asthma exacerbations.  Lung sounds were clear and I do not believe patient needs a chest x-ray at this time.  She will be given a IM Decadron shot in the department and discharged with a refill of her inhaler.  Given 2 puffs while here and she reports that this made her feel better.  Not in respiratory distress.  Considered ordering a D-dimer however no risk factors for PE.  Return precautions discussed at length and patient reported understanding.  Final Clinical Impression(s) / ED Diagnoses Final diagnoses:  Medication refill  Asthma, unspecified asthma severity, unspecified whether complicated, unspecified whether persistent    Rx / DC Orders ED Discharge Orders          Ordered    albuterol (VENTOLIN HFA) 108 (90 Base) MCG/ACT inhaler  Every 4 hours PRN        04/15/21 1420           Results and diagnoses were explained to the patient. Return precautions discussed in full. Patient had no additional questions and expressed complete understanding.   This chart was dictated using  voice recognition software.  Despite best efforts to proofread,  errors can occur which can change the documentation meaning.     Rhae Hammock, PA-C 04/15/21 Chical, [place], DO 04/15/21 1446

## 2021-04-15 NOTE — Progress Notes (Signed)
RT assessed in triage. BBS wheezes. Patient stated that she was out of MDI. 2 puffs albuterol MDI given with spacer. RT to further assess as needed when in a room.

## 2021-04-15 NOTE — ED Triage Notes (Addendum)
C/o out of asthma meds albuterol inhaler

## 2021-04-24 ENCOUNTER — Other Ambulatory Visit: Payer: Self-pay

## 2021-04-24 ENCOUNTER — Emergency Department (HOSPITAL_BASED_OUTPATIENT_CLINIC_OR_DEPARTMENT_OTHER)
Admission: EM | Admit: 2021-04-24 | Discharge: 2021-04-24 | Disposition: A | Discharge status: Home / Self Care | Payer: 59 | Attending: Emergency Medicine | Admitting: Emergency Medicine

## 2021-04-24 ENCOUNTER — Other Ambulatory Visit (HOSPITAL_BASED_OUTPATIENT_CLINIC_OR_DEPARTMENT_OTHER): Payer: Self-pay

## 2021-04-24 ENCOUNTER — Encounter (HOSPITAL_BASED_OUTPATIENT_CLINIC_OR_DEPARTMENT_OTHER): Payer: Self-pay

## 2021-04-24 DIAGNOSIS — L509 Urticaria, unspecified: Secondary | ICD-10-CM | POA: Diagnosis not present

## 2021-04-24 DIAGNOSIS — R21 Rash and other nonspecific skin eruption: Secondary | ICD-10-CM | POA: Diagnosis present

## 2021-04-24 DIAGNOSIS — L309 Dermatitis, unspecified: Secondary | ICD-10-CM

## 2021-04-24 MED ORDER — PREDNISONE 20 MG PO TABS
ORAL_TABLET | ORAL | 0 refills | Status: DC
  Filled 2021-04-24: qty 12, 6d supply, fill #0

## 2021-04-24 MED ORDER — PREDNISONE 50 MG PO TABS
60.0000 mg | ORAL_TABLET | Freq: Once | ORAL | Status: AC
  Administered 2021-04-24: 60 mg via ORAL
  Filled 2021-04-24: qty 1

## 2021-04-24 MED ORDER — DIPHENHYDRAMINE-ZINC ACETATE 2-0.1 % EX CREA
1.0000 "application " | TOPICAL_CREAM | Freq: Three times a day (TID) | CUTANEOUS | 0 refills | Status: DC | PRN
  Filled 2021-04-24: qty 28.4, 10d supply, fill #0

## 2021-04-24 NOTE — ED Triage Notes (Signed)
Pt c/o scattered rash x 1 week-NAD-steady gait °

## 2021-04-24 NOTE — ED Provider Notes (Signed)
[facility] EMERGENCY DEPARTMENT Provider Note   CSN: [id number] Arrival date & time: 04/24/21  1507     History  Chief Complaint  Patient presents with   Rash    Darlene Logan is a 46 y.o. female here with rash.  Patient states that she moved in with her mother temporarily.  She states that she may have drug allergy and her mother has dogs.  Patient states that she has been trying Benadryl with minimal relief.  Patient also has been using her triamcinolone cream.  Denies any throat swelling.  Denies any trouble breathing  The history is provided by the patient.      Home Medications Prior to Admission medications   Medication Sig Start Date End Date Taking? Authorizing Provider  ADVAIR DISKUS 250-50 MCG/DOSE AEPB Inhale 1 puff into the lungs 2 (two) times daily. 06/11/20   [provider]  albuterol (VENTOLIN HFA) 108 (90 Base) MCG/ACT inhaler Inhale 2 puffs into the lungs every 4 (four) hours as needed for wheezing or shortness of breath. 04/15/21   Logan, Darlene A, PA-C  cetirizine (ZYRTEC ALLERGY) 10 MG tablet Take 1 tablet (10 mg total) by mouth daily. 08/10/19   Dahlia Byes A, NP  ciprofloxacin (CIPRO) 500 MG tablet Take 1 tablet by mouth every 12 (twelve) hours. 07/09/20   [provider]  dicyclomine (BENTYL) 20 MG tablet Take 20 mg by mouth 4 (four) times daily. 07/09/20   [provider]  famotidine (PEPCID) 20 MG tablet Take 1 tablet (20 mg total) by mouth 2 (two) times daily. 06/20/20   Wallis Bamberg, PA-C  ferrous sulfate 325 (65 FE) MG tablet Take 325 mg by mouth daily with breakfast.    [provider]  fluticasone (FLONASE) 50 MCG/ACT nasal spray Place 2 sprays into both nostrils daily. 12/07/18   Joy, Shawn C, PA-C  methocarbamol (ROBAXIN) 500 MG tablet Take 1 tablet (500 mg total) by mouth 2 (two) times daily. 08/13/20   Darlene Lodge, PA-C  naproxen (NAPROSYN) 500 MG tablet Take 1 tablet (500 mg total) by mouth 2 (two) times  daily. 08/13/20   Darlene Lodge, PA-C  omeprazole (PRILOSEC) 20 MG capsule Take 1 capsule (20 mg total) by mouth daily. 06/20/20   Wallis Bamberg, PA-C  predniSONE (STERAPRED UNI-PAK 21 TAB) 10 MG (21) TBPK tablet Take by mouth daily. Take 6 tabs by mouth daily  for 2 days, then 5 tabs for 2 days, then 4 tabs for 2 days, then 3 tabs for 2 days, 2 tabs for 2 days, then 1 tab by mouth daily for 2 days 12/06/20   Idelle Leech, Hina, PA-C  triamcinolone cream (KENALOG) 0.1 % Apply 1 application topically 2 (two) times daily. 12/06/20   Logan, Hina, PA-C      Allergies    Claritin [loratadine], Other, Pecan nut (diagnostic), Shellfish allergy, Coconut fatty acids, and Egg white (egg protein)    Review of Systems   Review of Systems  Skin:  Positive for rash.  All other systems reviewed and are negative.  Physical Exam Updated Vital Signs BP (!) 143/72 (BP Location: Left Arm)    Pulse 93    Temp 98.1 F (36.7 C) (Oral)    Resp 18    Ht 5\' 5"  (1.651 m)    Wt 90.7 kg    LMP 04/18/2021    SpO2 100%    BMI 33.28 kg/m  Physical Exam Vitals and nursing note reviewed.  Constitutional:  Appearance: Normal appearance.  HENT:     Head: Normocephalic.     Nose: Nose normal.     Mouth/Throat:     Mouth: Mucous membranes are moist.     Comments: No oral lesions Eyes:     Extraocular Movements: Extraocular movements intact.     Pupils: Pupils are equal, round, and reactive to light.  Cardiovascular:     Rate and Rhythm: Normal rate and regular rhythm.     Pulses: Normal pulses.     Heart sounds: Normal heart sounds.  Pulmonary:     Effort: Pulmonary effort is normal.     Breath sounds: Normal breath sounds.  Abdominal:     General: Abdomen is flat.     Palpations: Abdomen is soft.  Musculoskeletal:        General: Normal range of motion.     Cervical back: Normal range of motion and neck supple.  Skin:    Comments: Patient has diffuse eczema and mild urticaria throughout.  There is no mucous  membrane involvement.  Neurological:     General: No focal deficit present.     Mental Status: She is alert and oriented to person, place, and time.  Psychiatric:        Mood and Affect: Mood normal.        Behavior: Behavior normal.    ED Results / Procedures / Treatments   Labs (all labs ordered are listed, but only abnormal results are displayed) Labs Reviewed - No data to display  EKG None  Radiology No results found.  Procedures Procedures    Medications Ordered in ED Medications  predniSONE (DELTASONE) tablet 60 mg (has no administration in time range)    ED Course/ Medical Decision Making/ A&P                           Medical Decision Making [name] is a 46 y.o. female here with rash.  Likely allergic reaction to dogs versus worsening eczema.  Patient has no mucous membrane involvement.  Vitals are stable.  Plan to discharge patient home with prednisone taper and Benadryl cream.  Encouraged her to continue her oral prednisone    Problems Addressed: Eczema, unspecified type: chronic illness or injury with exacerbation, progression, or side effects of treatment Rash: acute illness or injury Urticaria: acute illness or injury  Risk OTC drugs. Prescription drug management.  Final Clinical Impression(s) / ED Diagnoses Final diagnoses:  None    Rx / DC Orders ED Discharge Orders     None         Charlynne Pander, MD 04/24/21 [phone]

## 2021-04-24 NOTE — Discharge Instructions (Signed)
Please take prednisone as prescribed.  Continue Benadryl 50 mg every 6 hours as needed for itchiness.  You may also try Benadryl cream as well  Follow-up with your doctor  Return to ER if you have worse rash, trouble breathing

## 2021-04-24 NOTE — ED Notes (Signed)
Exacerbation of eczema; itching on neck, chest and face.  Eyes watery.  Took a Benadryl last night with temporary relief.   Is allergic to dogs and has been staying at her Mother's this week where there are dogs in the home.

## 2021-05-05 ENCOUNTER — Other Ambulatory Visit: Payer: Self-pay

## 2021-05-05 ENCOUNTER — Emergency Department (HOSPITAL_BASED_OUTPATIENT_CLINIC_OR_DEPARTMENT_OTHER): Payer: 59

## 2021-05-05 ENCOUNTER — Emergency Department (HOSPITAL_BASED_OUTPATIENT_CLINIC_OR_DEPARTMENT_OTHER)
Admission: EM | Admit: 2021-05-05 | Discharge: 2021-05-05 | Disposition: A | Discharge status: Home / Self Care | Payer: 59 | Attending: Emergency Medicine | Admitting: Emergency Medicine

## 2021-05-05 ENCOUNTER — Encounter (HOSPITAL_BASED_OUTPATIENT_CLINIC_OR_DEPARTMENT_OTHER): Payer: Self-pay | Admitting: *Deleted

## 2021-05-05 DIAGNOSIS — J45909 Unspecified asthma, uncomplicated: Secondary | ICD-10-CM | POA: Diagnosis not present

## 2021-05-05 DIAGNOSIS — R6889 Other general symptoms and signs: Secondary | ICD-10-CM

## 2021-05-05 DIAGNOSIS — R059 Cough, unspecified: Secondary | ICD-10-CM | POA: Diagnosis present

## 2021-05-05 DIAGNOSIS — Z7951 Long term (current) use of inhaled steroids: Secondary | ICD-10-CM | POA: Diagnosis not present

## 2021-05-05 DIAGNOSIS — Z20822 Contact with and (suspected) exposure to covid-19: Secondary | ICD-10-CM | POA: Diagnosis not present

## 2021-05-05 DIAGNOSIS — J029 Acute pharyngitis, unspecified: Secondary | ICD-10-CM | POA: Diagnosis not present

## 2021-05-05 LAB — GROUP A STREP BY PCR: Group A Strep by PCR: NOT DETECTED

## 2021-05-05 LAB — RESP PANEL BY RT-PCR (FLU A&B, COVID) ARPGX2
Influenza A by PCR: NEGATIVE
Influenza B by PCR: NEGATIVE
SARS Coronavirus 2 by RT PCR: NEGATIVE

## 2021-05-05 NOTE — Discharge Instructions (Signed)
COVID flu and rapid strep all negative.  Symptoms therefore consistent with viral illness.  Over-the-counter cold and flu.  Motrin may help significantly with throat pain.  Work note provided.  Return for any new or worse symptoms.

## 2021-05-05 NOTE — ED Triage Notes (Signed)
Saturday began having a sore throat, cough, congestion. Has been around sick family members

## 2021-05-05 NOTE — ED Provider Notes (Signed)
[facility] EMERGENCY DEPARTMENT Provider Note   CSN: [id number] Arrival date & time: 05/05/21  0756     History  Chief Complaint  Patient presents with   Cough    Darlene Logan is a 46 y.o. female.  Patient with complaint of sore throat headache some cough some congestion since Saturday.  Patient has had strep throat in the past.  But not recently.  Past medical history sniffing for asthma eczema seasonal allergies and obesity.  Patient is a former smoker quit in 2009.      Home Medications Prior to Admission medications   Medication Sig Start Date End Date Taking? Authorizing Provider  ADVAIR DISKUS 250-50 MCG/DOSE AEPB Inhale 1 puff into the lungs 2 (two) times daily. 06/11/20   [provider]  albuterol (VENTOLIN HFA) 108 (90 Base) MCG/ACT inhaler Inhale 2 puffs into the lungs every 4 (four) hours as needed for wheezing or shortness of breath. 04/15/21   Redwine, Madison A, PA-C  cetirizine (ZYRTEC ALLERGY) 10 MG tablet Take 1 tablet (10 mg total) by mouth daily. 08/10/19   Loura Halt A, NP  ciprofloxacin (CIPRO) 500 MG tablet Take 1 tablet by mouth every 12 (twelve) hours. 07/09/20   [provider]  dicyclomine (BENTYL) 20 MG tablet Take 20 mg by mouth 4 (four) times daily. 07/09/20   [provider]  diphenhydrAMINE-zinc acetate (BENADRYL EXTRA STRENGTH) cream Apply 1 application topically 3 (three) times daily as needed for itching. 04/24/21   Drenda Freeze, MD  famotidine (PEPCID) 20 MG tablet Take 1 tablet (20 mg total) by mouth 2 (two) times daily. 06/20/20   Jaynee Eagles, PA-C  ferrous sulfate 325 (65 FE) MG tablet Take 325 mg by mouth daily with breakfast.    [provider]  fluticasone (FLONASE) 50 MCG/ACT nasal spray Place 2 sprays into both nostrils daily. 12/07/18   Joy, Shawn C, PA-C  methocarbamol (ROBAXIN) 500 MG tablet Take 1 tablet (500 mg total) by mouth 2 (two) times daily. 08/13/20   Jacqlyn Larsen, PA-C   naproxen (NAPROSYN) 500 MG tablet Take 1 tablet (500 mg total) by mouth 2 (two) times daily. 08/13/20   Jacqlyn Larsen, PA-C  omeprazole (PRILOSEC) 20 MG capsule Take 1 capsule (20 mg total) by mouth daily. 06/20/20   Jaynee Eagles, PA-C  predniSONE (DELTASONE) 20 MG tablet Take 3 tablets (60 mg) by mouth daily for 2 days, then 2 tablets (40 mg) daily for 2 days, and then 1 tablet (20 mg) daily for 2 days. 04/24/21   Drenda Freeze, MD  triamcinolone cream (KENALOG) 0.1 % Apply 1 application topically 2 (two) times daily. 12/06/20   Khatri, Hina, PA-C      Allergies    Claritin [loratadine], Other, Pecan nut (diagnostic), Shellfish allergy, Coconut fatty acids, and Egg white (egg protein)    Review of Systems   Review of Systems  Constitutional:  Negative for chills and fever.  HENT:  Positive for congestion and sore throat. Negative for ear pain.   Eyes:  Negative for pain and visual disturbance.  Respiratory:  Positive for cough. Negative for shortness of breath.   Cardiovascular:  Negative for chest pain and palpitations.  Gastrointestinal:  Negative for abdominal pain and vomiting.  Genitourinary:  Negative for dysuria and hematuria.  Musculoskeletal:  Negative for arthralgias and back pain.  Skin:  Negative for color change and rash.  Neurological:  Positive for headaches. Negative for seizures and syncope.  All other systems  reviewed and are negative.  Physical Exam Updated Vital Signs BP (!) 143/74 (BP Location: Right Arm)    Pulse 81    Temp 97.7 F (36.5 C) (Oral)    Resp 18    Ht 1.651 m (5\' 5" )    Wt 90.7 kg    LMP 04/18/2021    SpO2 100%    BMI 33.27 kg/m  Physical Exam Vitals and nursing note reviewed.  Constitutional:      General: She is not in acute distress.    Appearance: Normal appearance. She is well-developed.  HENT:     Head: Normocephalic and atraumatic.     Mouth/Throat:     Mouth: Mucous membranes are moist.     Pharynx: Posterior oropharyngeal erythema  present. No oropharyngeal exudate.     Comments: Soft palate with some erythema.  Uvula midline.  No exudate.  Erythema to the tonsils as well. Eyes:     Extraocular Movements: Extraocular movements intact.     Conjunctiva/sclera: Conjunctivae normal.     Pupils: Pupils are equal, round, and reactive to light.  Cardiovascular:     Rate and Rhythm: Normal rate and regular rhythm.     Heart sounds: No murmur heard. Pulmonary:     Effort: Pulmonary effort is normal. No respiratory distress.     Breath sounds: Normal breath sounds. No stridor. No wheezing, rhonchi or rales.  Abdominal:     Palpations: Abdomen is soft.     Tenderness: There is no abdominal tenderness.  Musculoskeletal:        General: No swelling.     Cervical back: Normal range of motion and neck supple.  Skin:    General: Skin is warm and dry.     Capillary Refill: Capillary refill takes less than 2 seconds.  Neurological:     General: No focal deficit present.     Mental Status: She is alert and oriented to person, place, and time.     Cranial Nerves: No cranial nerve deficit.     Sensory: No sensory deficit.     Motor: No weakness.     Coordination: Coordination normal.  Psychiatric:        Mood and Affect: Mood normal.    ED Results / Procedures / Treatments   Labs (all labs ordered are listed, but only abnormal results are displayed) Labs Reviewed  RESP PANEL BY RT-PCR (FLU A&B, COVID) ARPGX2  GROUP A STREP BY PCR    EKG None  Radiology DG Chest Portable 1 View  Result Date: 05/05/2021 CLINICAL DATA:  Cough and chest congestion for 3 days. EXAM: PORTABLE CHEST 1 VIEW COMPARISON:  01/04/2021 FINDINGS: The heart size and mediastinal contours are within normal limits. Both lungs are clear. The visualized skeletal structures are unremarkable. IMPRESSION: No active disease. Electronically Signed   By: Marlaine Hind M.D.   On: 05/05/2021 08:26    Procedures Procedures    Medications Ordered in  ED Medications - No data to display  ED Course/ Medical Decision Making/ A&P                           Medical Decision Making Amount and/or Complexity of Data Reviewed Radiology: ordered.   Patient with flulike illness.  Patient's COVID influenza negative.  Chest x-ray negative.  But patient's main complaint is the sore throat.  Has had strep in the past.  We will do a strep test.  Patient nontoxic no acute  distress.  Rapid strep negative.  Symptoms consistent with flulike illness.  Will treat symptomatically.   Final Clinical Impression(s) / ED Diagnoses Final diagnoses:  Flu-like symptoms  Pharyngitis, unspecified etiology    Rx / DC Orders ED Discharge Orders     None         Fredia Sorrow, MD 05/05/21 1002

## 2021-07-11 ENCOUNTER — Encounter: Payer: Self-pay | Admitting: Gastroenterology

## 2021-08-21 ENCOUNTER — Encounter (HOSPITAL_BASED_OUTPATIENT_CLINIC_OR_DEPARTMENT_OTHER): Payer: Self-pay | Admitting: Emergency Medicine

## 2021-08-21 ENCOUNTER — Other Ambulatory Visit: Payer: Self-pay

## 2021-08-21 ENCOUNTER — Emergency Department (HOSPITAL_BASED_OUTPATIENT_CLINIC_OR_DEPARTMENT_OTHER)
Admission: EM | Admit: 2021-08-21 | Discharge: 2021-08-21 | Disposition: A | Discharge status: Home / Self Care | Payer: 59 | Attending: Emergency Medicine | Admitting: Emergency Medicine

## 2021-08-21 DIAGNOSIS — R21 Rash and other nonspecific skin eruption: Secondary | ICD-10-CM | POA: Diagnosis present

## 2021-08-21 DIAGNOSIS — L2489 Irritant contact dermatitis due to other agents: Secondary | ICD-10-CM | POA: Insufficient documentation

## 2021-08-21 MED ORDER — PREDNISONE 10 MG (21) PO TBPK: ORAL_TABLET | Freq: Every day | ORAL | 0 refills | Status: DC

## 2021-08-21 NOTE — ED Notes (Signed)
Called to triage for with history asthma.  Using HFA albuterol more frequently, not using Advair has prescribed. BBS CTA, SpO2 99%, last HFA this AM. NPC.

## 2021-08-21 NOTE — ED Provider Notes (Signed)
[facility] EMERGENCY DEPARTMENT Provider Note   CSN: [id number] Arrival date & time: 08/21/21  1403     History  Chief Complaint  Patient presents with   multiple complaint    Darlene Logan is a 46 y.o. female.  HPI Patient is a 46 year old female with history of eczema, seasonal allergies, asthma, who presents to the emergency department due to a pruritic rash around the upper thighs, genitalia, as well as the perioral regions.  Also notes that recently her asthma has been worse than normal and she has been using her albuterol inhaler frequently during the chest pain, nausea, vomiting, dysuria, vaginal discharge.  Rash started after using a new toilet paper with a lavender scent.    Home Medications Prior to Admission medications   Medication Sig Start Date End Date Taking? Authorizing Provider  predniSONE (STERAPRED UNI-PAK 21 TAB) 10 MG (21) TBPK tablet Take by mouth daily. Take 6 tabs by mouth daily  for 2 days, then 5 tabs for 2 days, then 4 tabs for 2 days, then 3 tabs for 2 days, 2 tabs for 2 days, then 1 tab by mouth daily for 2 days 08/21/21  Yes Jaimee Corum, PA-C  ADVAIR DISKUS 250-50 MCG/DOSE AEPB Inhale 1 puff into the lungs 2 (two) times daily. 06/11/20   [provider]  albuterol (VENTOLIN HFA) 108 (90 Base) MCG/ACT inhaler Inhale 2 puffs into the lungs every 4 (four) hours as needed for wheezing or shortness of breath. 04/15/21   Redwine, Madison A, PA-C  cetirizine (ZYRTEC ALLERGY) 10 MG tablet Take 1 tablet (10 mg total) by mouth daily. 08/10/19   Dahlia Byes A, NP  ciprofloxacin (CIPRO) 500 MG tablet Take 1 tablet by mouth every 12 (twelve) hours. 07/09/20   [provider]  dicyclomine (BENTYL) 20 MG tablet Take 20 mg by mouth 4 (four) times daily. 07/09/20   [provider]  diphenhydrAMINE-zinc acetate (BENADRYL EXTRA STRENGTH) cream Apply 1 application topically 3 (three) times daily as needed for itching. 04/24/21   Charlynne Pander, MD  famotidine (PEPCID) 20 MG tablet Take 1 tablet (20 mg total) by mouth 2 (two) times daily. 06/20/20   Wallis Bamberg, PA-C  ferrous sulfate 325 (65 FE) MG tablet Take 325 mg by mouth daily with breakfast.    [provider]  fluticasone (FLONASE) 50 MCG/ACT nasal spray Place 2 sprays into both nostrils daily. 12/07/18   Joy, Shawn C, PA-C  methocarbamol (ROBAXIN) 500 MG tablet Take 1 tablet (500 mg total) by mouth 2 (two) times daily. 08/13/20   Dartha Lodge, PA-C  naproxen (NAPROSYN) 500 MG tablet Take 1 tablet (500 mg total) by mouth 2 (two) times daily. 08/13/20   Dartha Lodge, PA-C  omeprazole (PRILOSEC) 20 MG capsule Take 1 capsule (20 mg total) by mouth daily. 06/20/20   Wallis Bamberg, PA-C  triamcinolone cream (KENALOG) 0.1 % Apply 1 application topically 2 (two) times daily. 12/06/20   Khatri, Hina, PA-C      Allergies    Claritin [loratadine], Other, Pecan nut (diagnostic), Shellfish allergy, Coconut fatty acids, and Egg white (egg protein)    Review of Systems   Review of Systems  Respiratory:  Positive for wheezing. Negative for shortness of breath.   Cardiovascular:  Negative for chest pain.  Gastrointestinal:  Negative for nausea and vomiting.  Genitourinary:  Negative for dysuria and vaginal discharge.  Skin:  Positive for color change and rash.   Physical Exam Updated Vital Signs  BP 136/79 (BP Location: Right Arm)   Pulse 99   Temp 98.6 F (37 C) (Oral)   Resp 18   Ht 5\' 5"  (1.651 m)   Wt 93.9 kg   SpO2 99%   BMI 34.45 kg/m  Physical Exam Vitals and nursing note reviewed.  Constitutional:      General: She is not in acute distress.    Appearance: She is well-developed.  HENT:     Head: Normocephalic and atraumatic.     Right Ear: External ear normal.     Left Ear: External ear normal.     Mouth/Throat:     Pharynx: Oropharynx is clear.  Eyes:     General: No scleral icterus.       Right eye: No discharge.        Left eye: No discharge.      Conjunctiva/sclera: Conjunctivae normal.     Comments: Papular rash noted along the maxillary region of the face.  No discharge.  Sclera clear.  No drainage noted from the eyes.  Neck:     Trachea: No tracheal deviation.  Cardiovascular:     Rate and Rhythm: Normal rate and regular rhythm.     Pulses: Normal pulses.     Heart sounds: No murmur heard.   No friction rub. No gallop.  Pulmonary:     Effort: Pulmonary effort is normal. No respiratory distress.     Breath sounds: Normal breath sounds. No stridor. No wheezing, rhonchi or rales.     Comments: Lungs are clear to auscultation bilaterally.  No wheezes, rales, or rhonchi. Abdominal:     General: There is no distension.  Genitourinary:    Comments: Female nursing chaperone present.  Papular erythematous rash to the medial upper thighs and regions.  No discharge noted.  Overlying excoriations. Musculoskeletal:        General: No swelling or deformity.     Cervical back: Neck supple.  Skin:    General: Skin is warm and dry.     Findings: Rash present.  Neurological:     General: No focal deficit present.     Mental Status: She is alert and oriented to person, place, and time.     Cranial Nerves: Cranial nerve deficit: no gross deficits.   ED Results / Procedures / Treatments   Labs (all labs ordered are listed, but only abnormal results are displayed) Labs Reviewed - No data to display  EKG None  Radiology No results found.  Procedures Procedures   Medications Ordered in ED Medications - No data to display  ED Course/ Medical Decision Making/ A&P                           Medical Decision Making Risk Prescription drug management.   Patient is a 46 year old female who presents to the emergency department due to a pruritic rash to his, genitalia, as well as the periorbital region.  Also complains of increased wheezing.  On my exam patient has a papular rash to the maxillary region of the face, bilateral upper  thighs, inguinal regions.  Overlying excoriations.  No drainage noted.  Denies any vaginal discharge or dysuria. LCTAB without wheezing, rales or rhonchi. RRR without M/R/G.   Symptoms appear consistent with contact dermatitis. She does note that it began after using a new toilet paper that has a lavender scent in it. She has discontinued this.  Will DC on a course of prednisone for  her increased wheezing and rash. She denies a history of DM. Appears stable for DC at this time and she is agreeable. Discussed return precautions. Her questions were answered and she was amicable at the time of DC.  Final Clinical Impression(s) / ED Diagnoses Final diagnoses:  Irritant contact dermatitis due to other agents   Rx / DC Orders ED Discharge Orders          Ordered    predniSONE (STERAPRED UNI-PAK 21 TAB) 10 MG (21) TBPK tablet  Daily        08/21/21 1513              Placido Sou, PA-C 08/21/21 1522    Tegeler, Canary Brim, MD 08/21/21 [phone]

## 2021-08-21 NOTE — ED Triage Notes (Signed)
Presents with multiple complaints , rash around her genitalia , sts used new toilet paper . Also reports eyelids puffed ,  Adds feeling short of breath "lately" , have been using her inhaler more than usual .

## 2021-08-21 NOTE — Discharge Instructions (Addendum)
I am prescribing you a strong steroid medication called prednisone.  Please only take this as prescribed.  Please take it early in the morning, as this medication can be stimulating and make it difficult to sleep at night.  Please continue to use your albuterol inhaler as needed for your wheezing.  Please follow-up with your primary care provider for your symptoms as well as this visit today.  If you develop any new or worsening symptoms please come back to the emergency department.

## 2021-10-02 ENCOUNTER — Emergency Department (HOSPITAL_BASED_OUTPATIENT_CLINIC_OR_DEPARTMENT_OTHER): Payer: 59

## 2021-10-02 ENCOUNTER — Encounter (HOSPITAL_BASED_OUTPATIENT_CLINIC_OR_DEPARTMENT_OTHER): Payer: Self-pay | Admitting: Emergency Medicine

## 2021-10-02 ENCOUNTER — Emergency Department (HOSPITAL_BASED_OUTPATIENT_CLINIC_OR_DEPARTMENT_OTHER)
Admission: EM | Admit: 2021-10-02 | Discharge: 2021-10-02 | Disposition: A | Discharge status: Home / Self Care | Payer: 59 | Attending: Emergency Medicine | Admitting: Emergency Medicine

## 2021-10-02 ENCOUNTER — Other Ambulatory Visit: Payer: Self-pay

## 2021-10-02 DIAGNOSIS — J4541 Moderate persistent asthma with (acute) exacerbation: Secondary | ICD-10-CM | POA: Diagnosis not present

## 2021-10-02 DIAGNOSIS — Z7951 Long term (current) use of inhaled steroids: Secondary | ICD-10-CM | POA: Diagnosis not present

## 2021-10-02 DIAGNOSIS — R0602 Shortness of breath: Secondary | ICD-10-CM | POA: Diagnosis present

## 2021-10-02 DIAGNOSIS — Z91010 Allergy to peanuts: Secondary | ICD-10-CM | POA: Diagnosis not present

## 2021-10-02 DIAGNOSIS — R000 Tachycardia, unspecified: Secondary | ICD-10-CM | POA: Diagnosis not present

## 2021-10-02 MED ORDER — IPRATROPIUM-ALBUTEROL 0.5-2.5 (3) MG/3ML IN SOLN
3.0000 mL | Freq: Once | RESPIRATORY_TRACT | Status: AC
  Administered 2021-10-02: 3 mL via RESPIRATORY_TRACT
  Filled 2021-10-02: qty 3

## 2021-10-02 MED ORDER — MAGNESIUM SULFATE 2 GM/50ML IV SOLN
2.0000 g | Freq: Once | INTRAVENOUS | Status: AC
Start: 2021-10-02 — End: 2021-10-02
  Administered 2021-10-02: 2 g via INTRAVENOUS
  Filled 2021-10-02: qty 50

## 2021-10-02 MED ORDER — METHYLPREDNISOLONE SODIUM SUCC 125 MG IJ SOLR
125.0000 mg | Freq: Once | INTRAMUSCULAR | Status: AC
  Administered 2021-10-02: 125 mg via INTRAVENOUS
  Filled 2021-10-02: qty 2

## 2021-10-02 MED ORDER — ALBUTEROL SULFATE (2.5 MG/3ML) 0.083% IN NEBU
2.5000 mg | INHALATION_SOLUTION | Freq: Once | RESPIRATORY_TRACT | Status: AC
  Administered 2021-10-02: 2.5 mg via RESPIRATORY_TRACT
  Filled 2021-10-02: qty 3

## 2021-10-02 MED ORDER — PREDNISONE 10 MG (21) PO TBPK: ORAL_TABLET | Freq: Every day | ORAL | 0 refills | Status: DC

## 2021-10-02 NOTE — Discharge Instructions (Signed)
You were seen in the emergency department for worsening shortness of breath.  Your chest x-ray did not show any obvious signs of pneumonia.  Your symptoms improved with some breathing treatments steroids and magnesium.  We are putting you on a steroid taper.  Please continue albuterol 2 puffs every 4 hours as needed.  Follow-up with your regular doctor.  Return to the emergency department if any worsening or concerning symptoms.

## 2021-10-02 NOTE — ED Provider Notes (Signed)
[facility] EMERGENCY DEPARTMENT Provider Note   CSN: [id number] Arrival date & time: 10/02/21  2044     History  Chief Complaint  Patient presents with   Shortness of Breath    Darlene Logan is a 46 y.o. female.  She has a history of asthma.  Complaining of worsening shortness of breath and nonproductive cough that started earlier today.  She thinks the heat and humidity set it off.  Complaining of chest tightness.  Inhalers with minimal improvement.  No sick contacts or recent travel.  The history is provided by the patient.  Shortness of Breath Severity:  Severe Onset quality:  Gradual Duration:  1 day Timing:  Constant Progression:  Worsening Chronicity:  Recurrent Context: weather changes   Relieved by:  Nothing Worsened by:  Activity and coughing Ineffective treatments:  Inhaler Associated symptoms: chest pain, cough and wheezing   Associated symptoms: no abdominal pain, no fever, no hemoptysis and no sputum production   Risk factors: no tobacco use        Home Medications Prior to Admission medications   Medication Sig Start Date End Date Taking? Authorizing Provider  ADVAIR DISKUS 250-50 MCG/DOSE AEPB Inhale 1 puff into the lungs 2 (two) times daily. 06/11/20   [provider]  albuterol (VENTOLIN HFA) 108 (90 Base) MCG/ACT inhaler Inhale 2 puffs into the lungs every 4 (four) hours as needed for wheezing or shortness of breath. 04/15/21   Redwine, Madison A, PA-C  cetirizine (ZYRTEC ALLERGY) 10 MG tablet Take 1 tablet (10 mg total) by mouth daily. 08/10/19   Dahlia Byes A, NP  ciprofloxacin (CIPRO) 500 MG tablet Take 1 tablet by mouth every 12 (twelve) hours. 07/09/20   [provider]  dicyclomine (BENTYL) 20 MG tablet Take 20 mg by mouth 4 (four) times daily. 07/09/20   [provider]  diphenhydrAMINE-zinc acetate (BENADRYL EXTRA STRENGTH) cream Apply 1 application topically 3 (three) times daily as needed for itching. 04/24/21    Charlynne Pander, MD  famotidine (PEPCID) 20 MG tablet Take 1 tablet (20 mg total) by mouth 2 (two) times daily. 06/20/20   Wallis Bamberg, PA-C  ferrous sulfate 325 (65 FE) MG tablet Take 325 mg by mouth daily with breakfast.    [provider]  fluticasone (FLONASE) 50 MCG/ACT nasal spray Place 2 sprays into both nostrils daily. 12/07/18   Joy, Shawn C, PA-C  methocarbamol (ROBAXIN) 500 MG tablet Take 1 tablet (500 mg total) by mouth 2 (two) times daily. 08/13/20   Dartha Lodge, PA-C  naproxen (NAPROSYN) 500 MG tablet Take 1 tablet (500 mg total) by mouth 2 (two) times daily. 08/13/20   Dartha Lodge, PA-C  omeprazole (PRILOSEC) 20 MG capsule Take 1 capsule (20 mg total) by mouth daily. 06/20/20   Wallis Bamberg, PA-C  predniSONE (STERAPRED UNI-PAK 21 TAB) 10 MG (21) TBPK tablet Take by mouth daily. Take 6 tabs by mouth daily  for 2 days, then 5 tabs for 2 days, then 4 tabs for 2 days, then 3 tabs for 2 days, 2 tabs for 2 days, then 1 tab by mouth daily for 2 days 08/21/21   Placido Sou, PA-C  triamcinolone cream (KENALOG) 0.1 % Apply 1 application topically 2 (two) times daily. 12/06/20   Khatri, Hina, PA-C      Allergies    Claritin [loratadine], Other, Pecan nut (diagnostic), Shellfish allergy, Coconut fatty acids, and Egg white (egg protein)    Review of Systems   Review  of Systems  Constitutional:  Negative for fever.  Eyes:  Negative for visual disturbance.  Respiratory:  Positive for cough, shortness of breath and wheezing. Negative for hemoptysis and sputum production.   Cardiovascular:  Positive for chest pain.  Gastrointestinal:  Negative for abdominal pain.    Physical Exam Updated Vital Signs BP 133/80 (BP Location: Left Arm)   Pulse (!) 118   Temp 98.5 F (36.9 C) (Oral)   Resp (!) 42   LMP 09/14/2021 (Approximate)   SpO2 99%  Physical Exam Vitals and nursing note reviewed.  Constitutional:      General: She is in acute distress.     Appearance: She is  well-developed.  HENT:     Head: Normocephalic and atraumatic.  Eyes:     Conjunctiva/sclera: Conjunctivae normal.  Cardiovascular:     Rate and Rhythm: Regular rhythm. Tachycardia present.     Heart sounds: No murmur heard. Pulmonary:     Effort: Tachypnea and accessory muscle usage present. No respiratory distress.     Breath sounds: Decreased breath sounds and wheezing present.  Abdominal:     Palpations: Abdomen is soft.     Tenderness: There is no abdominal tenderness. There is no guarding or rebound.  Musculoskeletal:        General: No swelling.     Cervical back: Neck supple.     Right lower leg: No tenderness.     Left lower leg: No tenderness.  Skin:    General: Skin is warm and dry.     Capillary Refill: Capillary refill takes less than 2 seconds.  Neurological:     General: No focal deficit present.     Mental Status: She is alert.     ED Results / Procedures / Treatments   Labs (all labs ordered are listed, but only abnormal results are displayed) Labs Reviewed - No data to display  EKG None  Radiology DG Chest [facility] 1 View  Result Date: 10/02/2021 CLINICAL DATA:  Shortness of breath, history of asthma. EXAM: PORTABLE CHEST 1 VIEW COMPARISON:  05/05/2021. FINDINGS: The heart size and mediastinal contours are within normal limits. No consolidation, effusion, or pneumothorax. No acute osseous abnormality. IMPRESSION: No active disease. Electronically Signed   By: Thornell Sartorius M.D.   On: 10/02/2021 21:19    Procedures Procedures    Medications Ordered in ED Medications  magnesium sulfate IVPB 2 g 50 mL (has no administration in time range)  methylPREDNISolone sodium succinate (SOLU-MEDROL) 125 mg/2 mL injection 125 mg (has no administration in time range)  ipratropium-albuterol (DUONEB) 0.5-2.5 (3) MG/3ML nebulizer solution 3 mL (3 mLs Nebulization Given 10/02/21 2052)  albuterol (PROVENTIL) (2.5 MG/3ML) 0.083% nebulizer solution 2.5 mg (2.5 mg Nebulization  Given 10/02/21 2052)    ED Course/ Medical Decision Making/ A&P Clinical Course as of 10/03/21 0852  Thu Oct 02, 2021  2118 Chest x-ray interpreted by me as no acute infiltrates.  Awaiting radiology reading. [MB]  2237 Patient feels her breathing is much better after breathing treatments and steroids.  She is comfortable plan for discharge.  Return instructions discussed [MB]    Clinical Course User Index [MB] Terrilee Files, MD                           Medical Decision Making Amount and/or Complexity of Data Reviewed Radiology: ordered.  Risk Prescription drug management.  This patient complains of shortness of breath cough; this involves an extensive  number of treatment Options and is a complaint that carries with it a high risk of complications and morbidity. The differential includes asthma, pneumonia, COVID, flu, pneumothorax, PE  I ordered medication IV steroids and breathing treatments IV magnesium and reviewed PMP when indicated. I ordered imaging studies which included chest x-ray and I independently    visualized and interpreted imaging which showed no acute infiltrate Previous records obtained and reviewed in epic including prior ED visits Cardiac monitoring reviewed, has tachycardia Social determinants considered, no significant barriers Critical Interventions: None  After the interventions stated above, I reevaluated the patient and found patient to be symptomatically improved Admission and further testing considered, she is comfortable plan for discharge and continuing treatments at home.  No indications for admission at this time.  Return instructions discussed          Final Clinical Impression(s) / ED Diagnoses Final diagnoses:  Moderate persistent asthma with exacerbation    Rx / DC Orders ED Discharge Orders          Ordered    predniSONE (STERAPRED UNI-PAK 21 TAB) 10 MG (21) TBPK tablet  Daily        10/02/21 2238               Terrilee Files, MD 10/03/21 [phone]

## 2021-10-02 NOTE — ED Triage Notes (Signed)
Pt reports shob that started earlier today. Hx of asthma. Took home inhaler with some relief. RT in triage to assess.

## 2022-01-15 ENCOUNTER — Emergency Department (HOSPITAL_BASED_OUTPATIENT_CLINIC_OR_DEPARTMENT_OTHER)
Admission: EM | Admit: 2022-01-15 | Discharge: 2022-01-15 | Disposition: A | Discharge status: Home / Self Care | Payer: Commercial Managed Care - HMO | Attending: Emergency Medicine | Admitting: Emergency Medicine

## 2022-01-15 ENCOUNTER — Encounter (HOSPITAL_BASED_OUTPATIENT_CLINIC_OR_DEPARTMENT_OTHER): Payer: Self-pay | Admitting: Emergency Medicine

## 2022-01-15 ENCOUNTER — Other Ambulatory Visit: Payer: Self-pay

## 2022-01-15 DIAGNOSIS — J45909 Unspecified asthma, uncomplicated: Secondary | ICD-10-CM | POA: Insufficient documentation

## 2022-01-15 DIAGNOSIS — R0602 Shortness of breath: Secondary | ICD-10-CM | POA: Diagnosis present

## 2022-01-15 DIAGNOSIS — Z87891 Personal history of nicotine dependence: Secondary | ICD-10-CM | POA: Diagnosis not present

## 2022-01-15 MED ORDER — PREDNISONE 50 MG PO TABS
60.0000 mg | ORAL_TABLET | Freq: Once | ORAL | Status: AC
Start: 2022-01-15 — End: 2022-01-15
  Administered 2022-01-15: 60 mg via ORAL
  Filled 2022-01-15: qty 1

## 2022-01-15 MED ORDER — ALBUTEROL SULFATE HFA 108 (90 BASE) MCG/ACT IN AERS: 2.0000 | INHALATION_SPRAY | RESPIRATORY_TRACT | 1 refills | Status: DC | PRN

## 2022-01-15 MED ORDER — ALBUTEROL SULFATE HFA 108 (90 BASE) MCG/ACT IN AERS
2.0000 | INHALATION_SPRAY | Freq: Once | RESPIRATORY_TRACT | Status: AC
Start: 2022-01-15 — End: 2022-01-15
  Administered 2022-01-15: 2 via RESPIRATORY_TRACT
  Filled 2022-01-15: qty 6.7

## 2022-01-15 MED ORDER — PREDNISONE 20 MG PO TABS
40.0000 mg | ORAL_TABLET | Freq: Every day | ORAL | 0 refills | Status: AC
Start: 2022-01-15 — End: 2022-01-19

## 2022-01-15 NOTE — ED Provider Notes (Signed)
[facility] Emergency Department Provider Note MRN:  [id number]  Arrival date & time: 01/15/22     Chief Complaint   Shortness of Breath   History of Present Illness   Darlene Logan is a 46 y.o. year-old female with a history of asthma presenting to the ED with chief complaint of SOB.  1-2 weeks of SOB and mild chest tightness that she attributes to her asthma.  Feels the same as prior flare ups.  Has been living with a friend who smokes and so she thinks that is what is triggering it.  Also ran out of her inhaler recently.  No fever, mild cough, no recent leg pain or swelling.  Review of Systems  A thorough review of systems was obtained and all systems are negative except as noted in the HPI and PMH.   Patient's Health History    Past Medical History:  Diagnosis Date   Anemia    Asthma    Eczema    Obesity    Seasonal allergies     Past Surgical History:  Procedure Laterality Date   NO PAST SURGERIES      Family History  Problem Relation Age of Onset   Hypertension Maternal Aunt    HIV/AIDS Mother    Diabetes Sister    Diabetes Maternal Aunt     Social History   Socioeconomic History   Marital status: Single    Spouse name: Not on file   Number of children: 5   Years of education: Not on file   Highest education level: Not on file  Occupational History   Occupation: [employer]  Tobacco Use   Smoking status: Former    Types: Cigarettes    Quit date: 07/12/2007    Years since quitting: 14.5   Smokeless tobacco: Never  Vaping Use   Vaping Use: Never used  Substance and Sexual Activity   Alcohol use: Yes    Comment: occasional   Drug use: Never   Sexual activity: Not on file  Other Topics Concern   Not on file  Social History Narrative   Not on file   Social Determinants of Health   Financial Resource Strain: Not on file  Food Insecurity: Not on file  Transportation Needs: Not on file  Physical Activity: Not on file   Stress: Not on file  Social Connections: Not on file  Intimate Partner Violence: Not on file     Physical Exam   Vitals:   01/15/22 0246  BP: 118/65  Pulse: 85  Resp: 16  Temp: 97.9 F (36.6 C)  SpO2: 100%    CONSTITUTIONAL:  well-appearing, NAD NEURO/PSYCH:  Alert and oriented x 3, no focal deficits EYES:  eyes equal and reactive ENT/NECK:  no LAD, no JVD CARDIO:  regular rate, well-perfused, normal S1 and S2 PULM:  occasional faint wheezes GI/GU:  non-distended, non-tender MSK/SPINE:  No gross deformities, no edema SKIN:  no rash, atraumatic   *Additional and/or pertinent findings included in MDM below  Diagnostic and Interventional Summary    EKG Interpretation  Date/Time:    Ventricular Rate:    PR Interval:    QRS Duration:   QT Interval:    QTC Calculation:   R Axis:     Text Interpretation:         Labs Reviewed - No data to display  No orders to display    Medications  albuterol (VENTOLIN HFA) 108 (90 Base) MCG/ACT inhaler 2 puff (2 puffs  Inhalation Given 01/15/22 0302)  predniSONE (DELTASONE) tablet 60 mg (60 mg Oral Given 01/15/22 0304)     Procedures  /  Critical Care Procedures  ED Course and Medical Decision Making  Initial Impression and Ddx Presentation is consistent with a mild asthma exacerbation.  No fever, nothing concerning on lung auscultation to suggest pneumonia or pneumothorax.  No leg pain or swelling, highly doubt PE.  PERC negative.    Past medical/surgical history that increases complexity of ED encounter:  asthma  Interpretation of Diagnostics No diagnostics performed as they were not clinically indicated.  Patient Reassessment and Ultimate Disposition/Management     Discharge.  Patient management required discussion with the following services or consulting groups:  None  Complexity of Problems Addressed Chronic illness with exacerbation  Additional Data Reviewed and Analyzed Further history obtained  from: Prior ED visit notes  Additional Factors Impacting ED Encounter Risk Prescriptions  Elmer Sow. Pilar Plate, MD [facility] Emergency Medicine [facility] [email or web address]  Final Clinical Impressions(s) / ED Diagnoses     ICD-10-CM   1. Uncomplicated asthma, unspecified asthma severity, unspecified whether persistent  J45.909       ED Discharge Orders          Ordered    albuterol (VENTOLIN HFA) 108 (90 Base) MCG/ACT inhaler  Every 4 hours PRN        01/15/22 0306    predniSONE (DELTASONE) 20 MG tablet  Daily        01/15/22 0306             Discharge Instructions Discussed with and Provided to Patient:    Discharge Instructions      You were evaluated in the Emergency Department and after careful evaluation, we did not find any emergent condition requiring admission or further testing in the hospital.  Your exam/testing today was overall reassuring.  Symptoms likely due to a flare of your asthma.  Take the prednisone steroid medication as directed and use the inhaler every 4 hours as needed.  Please return to the Emergency Department if you experience any worsening of your condition.  Thank you for allowing [place] to be a part of your care.       Sabas Sous, MD 01/15/22 [phone]

## 2022-01-15 NOTE — Discharge Instructions (Signed)
You were evaluated in the Emergency Department and after careful evaluation, we did not find any emergent condition requiring admission or further testing in the hospital.  Your exam/testing today was overall reassuring.  Symptoms likely due to a flare of your asthma.  Take the prednisone steroid medication as directed and use the inhaler every 4 hours as needed.  Please return to the Emergency Department if you experience any worsening of your condition.  Thank you for allowing [place] to be a part of your care.

## 2022-01-15 NOTE — ED Triage Notes (Signed)
Pt states SOB due to asthma X 1.5 weeks using home meds and not getting better.

## 2022-02-11 IMAGING — CR DG CHEST 2V
2 series · 2 of 2 positions shown · non-contrast
Comparison: 03/30/2018

CLINICAL DATA: Chest pain, worse with certain movements

EXAM:
CHEST - 2 VIEW

[w chest pa]
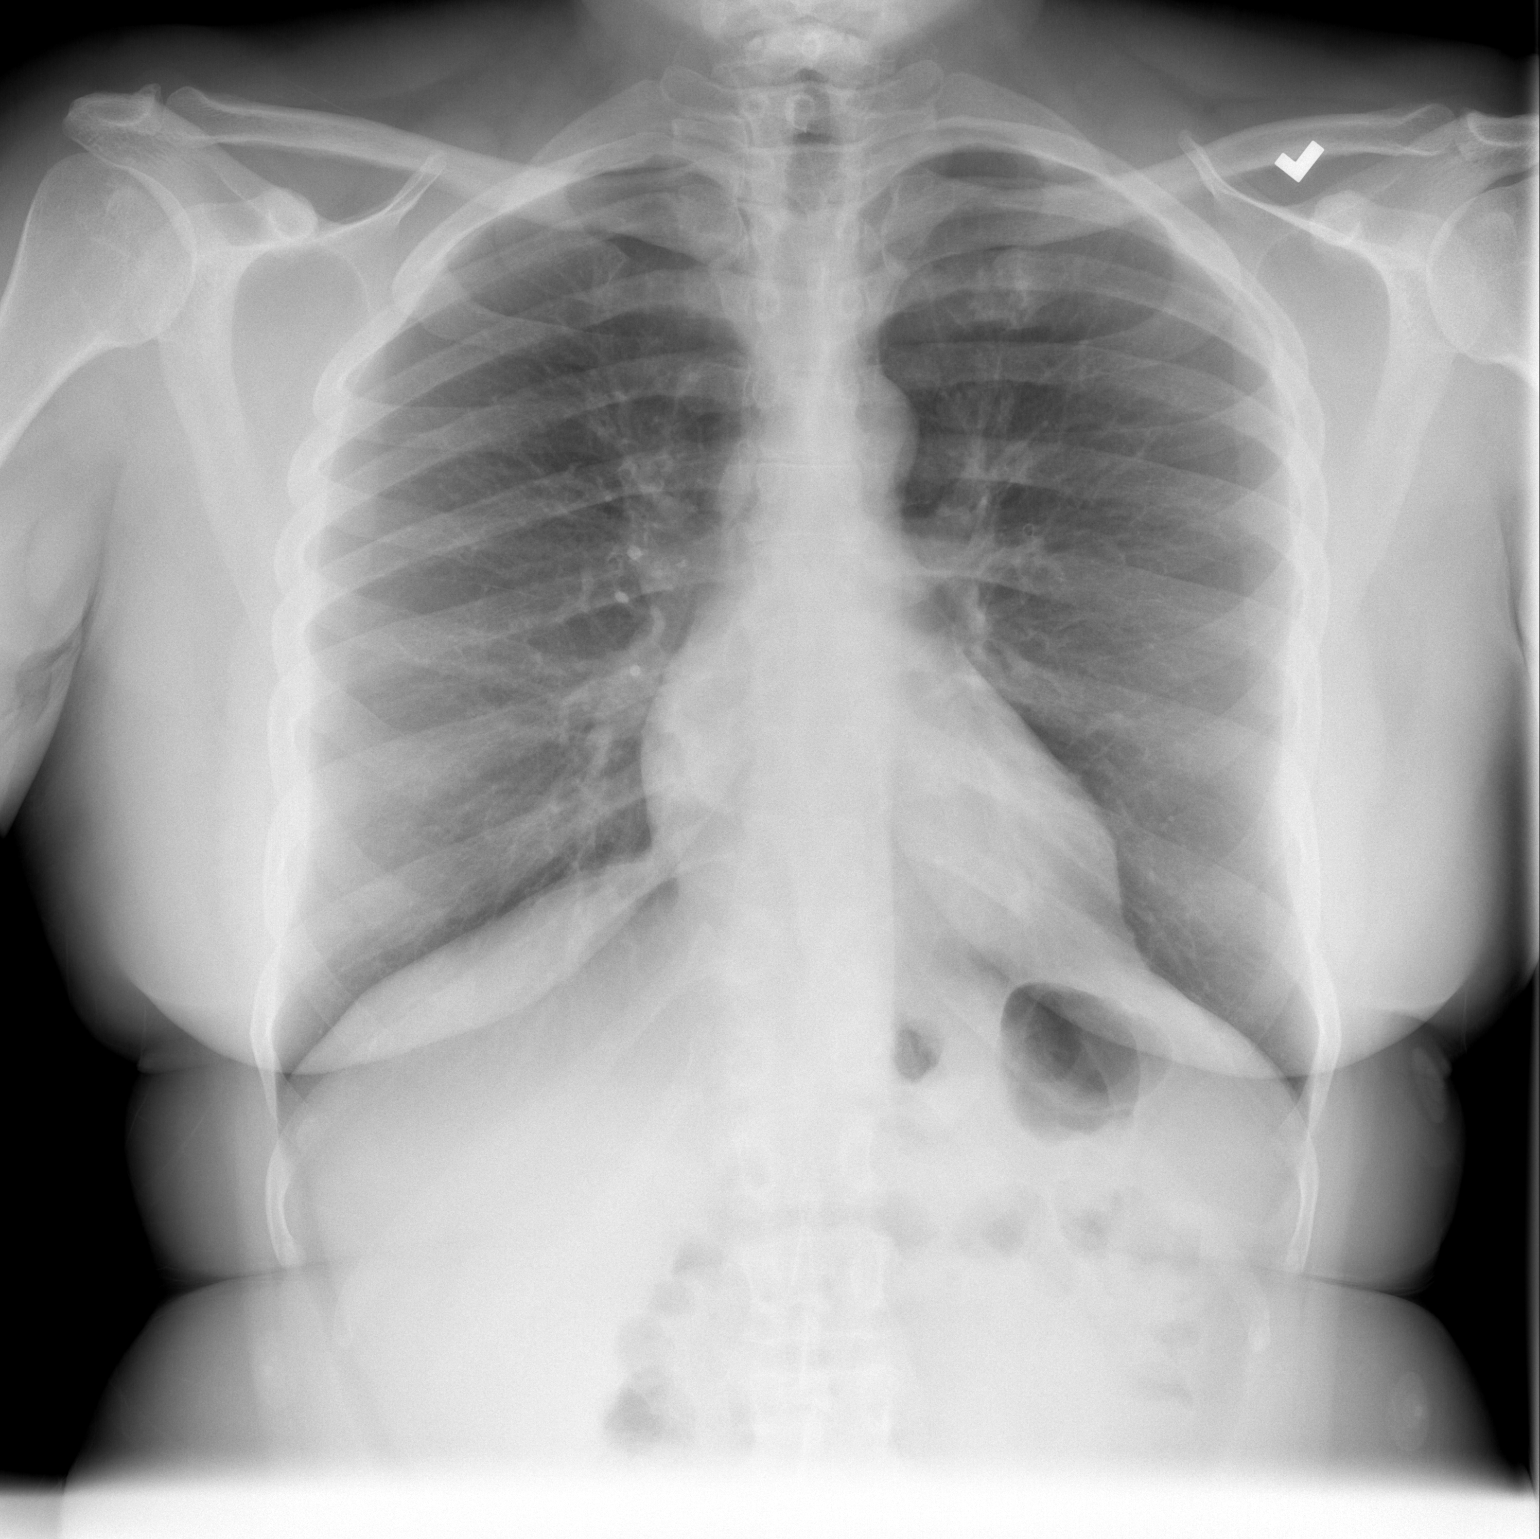

[w chest lat]
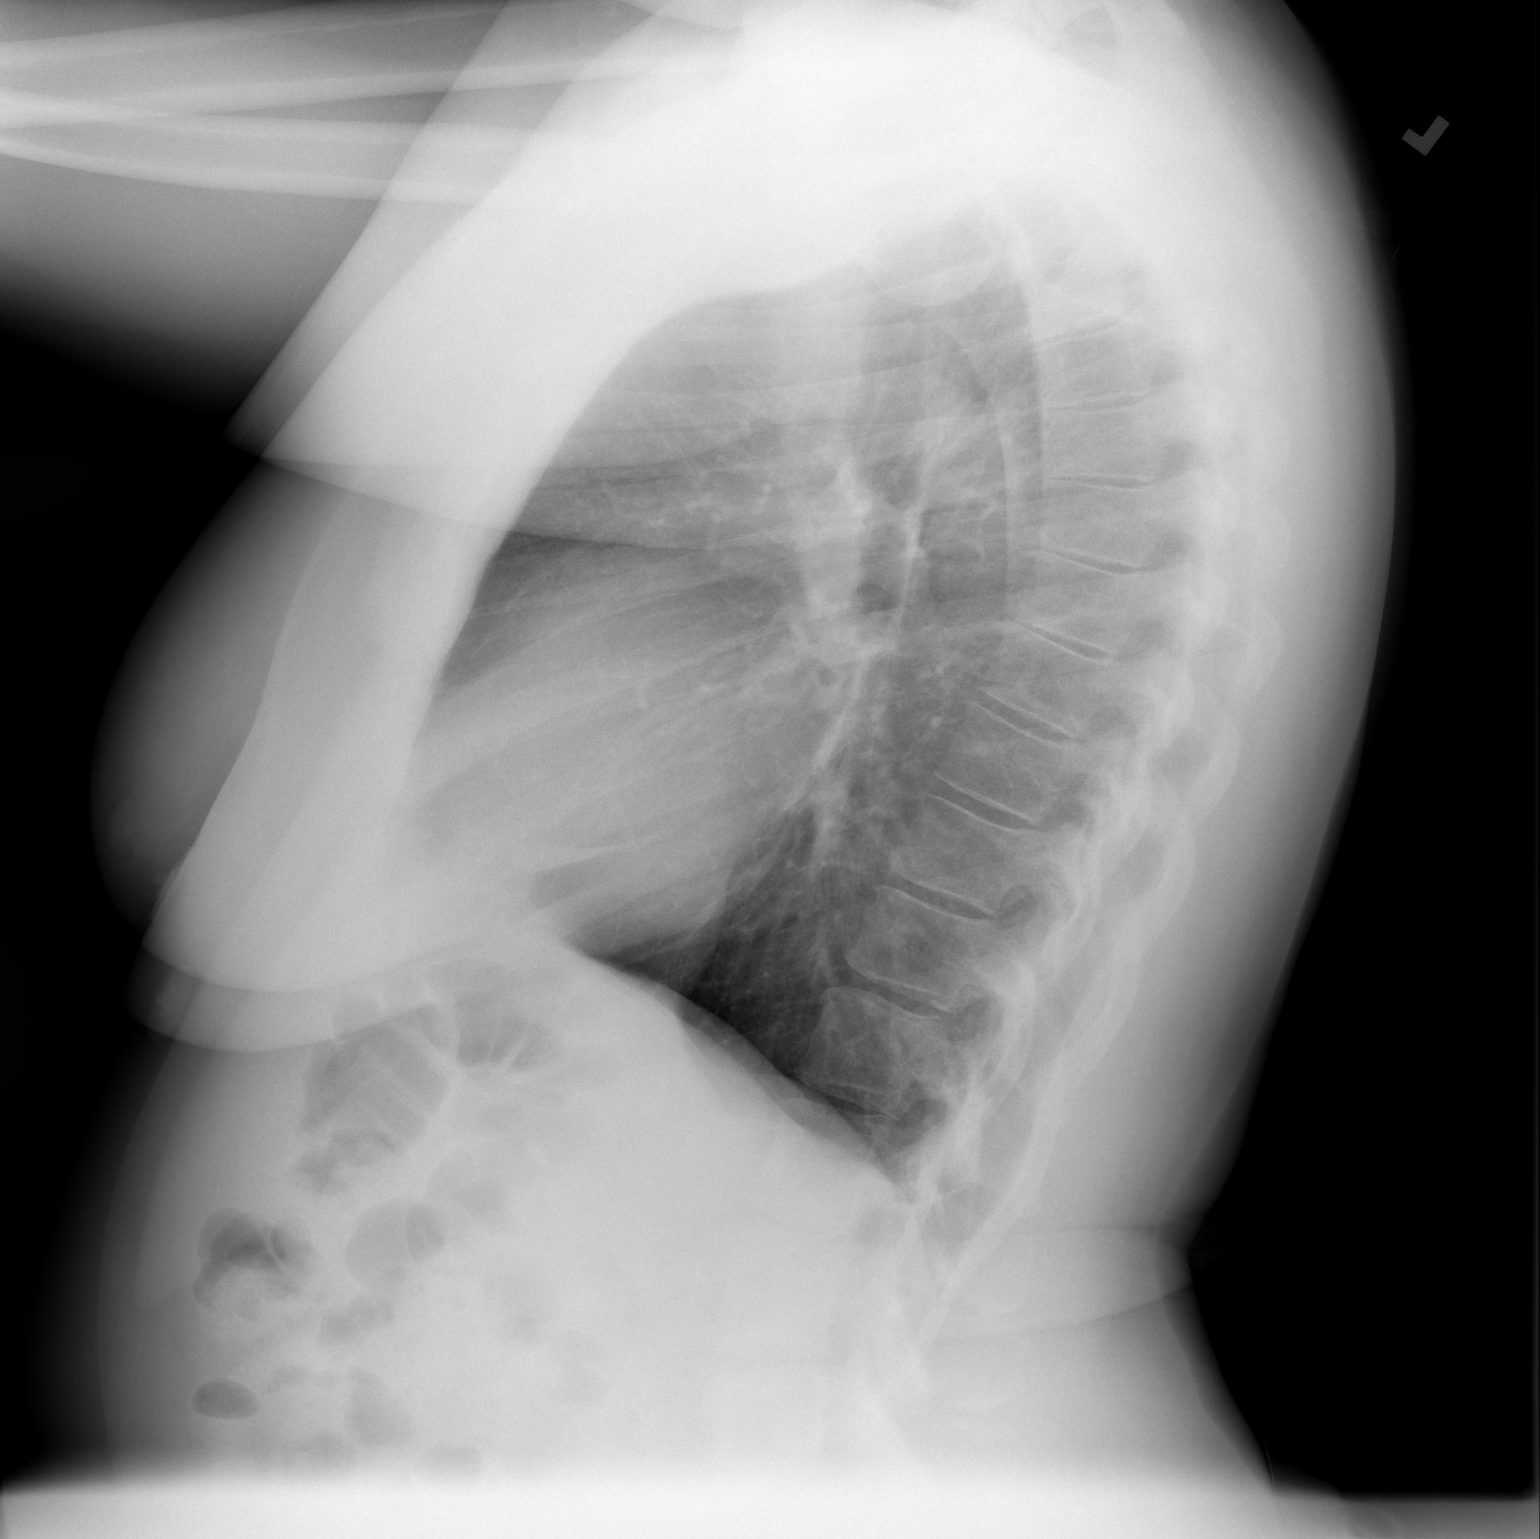

[2 of 2 positions shown; findings below may reference images not displayed]

FINDINGS: No consolidation, features of edema, pneumothorax, or effusion.
Pulmonary vascularity is normally distributed. The cardiomediastinal
contours are unremarkable. No acute osseous or soft tissue
abnormality.
IMPRESSION: No acute cardiopulmonary abnormality.

## 2022-06-10 ENCOUNTER — Other Ambulatory Visit: Payer: Self-pay

## 2022-06-10 ENCOUNTER — Emergency Department (HOSPITAL_BASED_OUTPATIENT_CLINIC_OR_DEPARTMENT_OTHER)
Admission: EM | Admit: 2022-06-10 | Discharge: 2022-06-10 | Disposition: A | Discharge status: Home / Self Care | Payer: Medicaid Other | Attending: Emergency Medicine | Admitting: Emergency Medicine

## 2022-06-10 ENCOUNTER — Emergency Department (HOSPITAL_BASED_OUTPATIENT_CLINIC_OR_DEPARTMENT_OTHER): Payer: Medicaid Other

## 2022-06-10 ENCOUNTER — Encounter (HOSPITAL_BASED_OUTPATIENT_CLINIC_OR_DEPARTMENT_OTHER): Payer: Self-pay | Admitting: Emergency Medicine

## 2022-06-10 DIAGNOSIS — J45901 Unspecified asthma with (acute) exacerbation: Secondary | ICD-10-CM | POA: Diagnosis present

## 2022-06-10 DIAGNOSIS — Z1152 Encounter for screening for COVID-19: Secondary | ICD-10-CM | POA: Insufficient documentation

## 2022-06-10 DIAGNOSIS — Z91010 Allergy to peanuts: Secondary | ICD-10-CM | POA: Diagnosis not present

## 2022-06-10 DIAGNOSIS — J4541 Moderate persistent asthma with (acute) exacerbation: Secondary | ICD-10-CM | POA: Diagnosis not present

## 2022-06-10 DIAGNOSIS — Z7951 Long term (current) use of inhaled steroids: Secondary | ICD-10-CM | POA: Diagnosis not present

## 2022-06-10 LAB — RESP PANEL BY RT-PCR (RSV, FLU A&B, COVID)  RVPGX2
Influenza A by PCR: NEGATIVE
Influenza B by PCR: NEGATIVE
Resp Syncytial Virus by PCR: NEGATIVE
SARS Coronavirus 2 by RT PCR: NEGATIVE

## 2022-06-10 MED ORDER — IPRATROPIUM-ALBUTEROL 0.5-2.5 (3) MG/3ML IN SOLN
RESPIRATORY_TRACT | Status: AC
  Administered 2022-06-10: 3 mL
  Filled 2022-06-10: qty 3

## 2022-06-10 MED ORDER — ALBUTEROL SULFATE HFA 108 (90 BASE) MCG/ACT IN AERS
1.0000 | INHALATION_SPRAY | Freq: Once | RESPIRATORY_TRACT | Status: AC
  Administered 2022-06-10: 1 via RESPIRATORY_TRACT
  Filled 2022-06-10: qty 6.7

## 2022-06-10 MED ORDER — ALBUTEROL SULFATE HFA 108 (90 BASE) MCG/ACT IN AERS: 1.0000 | INHALATION_SPRAY | Freq: Four times a day (QID) | RESPIRATORY_TRACT | 0 refills | Status: DC | PRN

## 2022-06-10 MED ORDER — ALBUTEROL (5 MG/ML) CONTINUOUS INHALATION SOLN
7.5000 mg/h | INHALATION_SOLUTION | RESPIRATORY_TRACT | Status: AC
  Administered 2022-06-10: 10 mg/h via RESPIRATORY_TRACT
  Filled 2022-06-10: qty 20

## 2022-06-10 MED ORDER — DEXAMETHASONE SODIUM PHOSPHATE 10 MG/ML IJ SOLN
10.0000 mg | Freq: Once | INTRAMUSCULAR | Status: AC
Start: 2022-06-10 — End: 2022-06-10
  Administered 2022-06-10: 10 mg via INTRAVENOUS
  Filled 2022-06-10: qty 1

## 2022-06-10 NOTE — ED Triage Notes (Signed)
Pt c/o shob that started 1 hr pta. Reports hx of asthma. Used home inhale with no relief. RT in triage to assess.

## 2022-06-10 NOTE — Discharge Instructions (Signed)
Thank you for letting us take care of you today.  We treated you for an asthma exacerbation with multiple breathing treatments and steroids. Your chest x-ray was negative. Please follow up on swab results via MyChart. Continue to use the inhaler given in the ED and/or the one prescribed to treat your asthma at home as needed. Please follow up with a PCP as soon as you are able. If unable to follow up with your PCP, I have provided the information for 2 primary care clinics you may contact to attempt to arrange follow up appointment.   If you develop any new or worsening symptoms such as chest pain, shortness of breath, airway swelling, difficulty swallowing, or other concerns, please be re-evaluated in the nearest emergency department.

## 2022-06-10 NOTE — ED Notes (Signed)
Discharge paperwork reviewed entirely with patient, including Rx's and follow up care. Pain was under control. Pt verbalized understanding as well as all parties involved. No questions or concerns voiced at the time of discharge. No acute distress noted.   Pt ambulated out to PVA without incident or assistance.  
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
## 2022-09-02 ENCOUNTER — Other Ambulatory Visit: Payer: Self-pay | Admitting: Physician Assistant

## 2022-09-02 DIAGNOSIS — Z1231 Encounter for screening mammogram for malignant neoplasm of breast: Secondary | ICD-10-CM

## 2022-10-11 ENCOUNTER — Other Ambulatory Visit: Payer: Self-pay

## 2022-10-11 ENCOUNTER — Emergency Department (HOSPITAL_BASED_OUTPATIENT_CLINIC_OR_DEPARTMENT_OTHER)
Admission: EM | Admit: 2022-10-11 | Discharge: 2022-10-11 | Disposition: A | Discharge status: Home / Self Care | Payer: Medicaid Other | Source: Home / Self Care | Attending: Emergency Medicine | Admitting: Emergency Medicine

## 2022-10-11 ENCOUNTER — Encounter (HOSPITAL_COMMUNITY): Payer: Self-pay

## 2022-10-11 ENCOUNTER — Emergency Department (HOSPITAL_COMMUNITY)
Admission: EM | Admit: 2022-10-11 | Discharge: 2022-10-11 | Discharge status: Against Medical Advice | Payer: Medicaid Other | Attending: Emergency Medicine | Admitting: Emergency Medicine

## 2022-10-11 ENCOUNTER — Encounter (HOSPITAL_BASED_OUTPATIENT_CLINIC_OR_DEPARTMENT_OTHER): Payer: Self-pay

## 2022-10-11 ENCOUNTER — Emergency Department (HOSPITAL_BASED_OUTPATIENT_CLINIC_OR_DEPARTMENT_OTHER): Payer: Medicaid Other

## 2022-10-11 DIAGNOSIS — J4521 Mild intermittent asthma with (acute) exacerbation: Secondary | ICD-10-CM | POA: Insufficient documentation

## 2022-10-11 DIAGNOSIS — Z5321 Procedure and treatment not carried out due to patient leaving prior to being seen by health care provider: Secondary | ICD-10-CM | POA: Insufficient documentation

## 2022-10-11 DIAGNOSIS — Z87891 Personal history of nicotine dependence: Secondary | ICD-10-CM | POA: Diagnosis not present

## 2022-10-11 DIAGNOSIS — R0602 Shortness of breath: Secondary | ICD-10-CM | POA: Diagnosis present

## 2022-10-11 DIAGNOSIS — E669 Obesity, unspecified: Secondary | ICD-10-CM | POA: Insufficient documentation

## 2022-10-11 DIAGNOSIS — M549 Dorsalgia, unspecified: Secondary | ICD-10-CM | POA: Diagnosis not present

## 2022-10-11 DIAGNOSIS — Z6835 Body mass index (BMI) 35.0-35.9, adult: Secondary | ICD-10-CM | POA: Insufficient documentation

## 2022-10-11 DIAGNOSIS — D649 Anemia, unspecified: Secondary | ICD-10-CM | POA: Diagnosis not present

## 2022-10-11 DIAGNOSIS — J45909 Unspecified asthma, uncomplicated: Secondary | ICD-10-CM | POA: Insufficient documentation

## 2022-10-11 LAB — CBC WITH DIFFERENTIAL/PLATELET
Abs Immature Granulocytes: 0.02 10*3/uL (ref 0.00–0.07)
Basophils Absolute: 0 10*3/uL (ref 0.0–0.1)
Basophils Relative: 1 %
Eosinophils Absolute: 0.2 10*3/uL (ref 0.0–0.5)
Eosinophils Relative: 4 %
HCT: 30.3 % — ABNORMAL LOW (ref 36.0–46.0)
Hemoglobin: 9.1 g/dL — ABNORMAL LOW (ref 12.0–15.0)
Immature Granulocytes: 0 %
Lymphocytes Relative: 27 %
Lymphs Abs: 1.5 10*3/uL (ref 0.7–4.0)
MCH: 21.8 pg — ABNORMAL LOW (ref 26.0–34.0)
MCHC: 30 g/dL (ref 30.0–36.0)
MCV: 72.5 fL — ABNORMAL LOW (ref 80.0–100.0)
Monocytes Absolute: 0.5 10*3/uL (ref 0.1–1.0)
Monocytes Relative: 9 %
Neutro Abs: 3.3 10*3/uL (ref 1.7–7.7)
Neutrophils Relative %: 59 %
Platelets: 397 10*3/uL (ref 150–400)
RBC: 4.18 MIL/uL (ref 3.87–5.11)
RDW: 18.2 % — ABNORMAL HIGH (ref 11.5–15.5)
WBC: 5.5 10*3/uL (ref 4.0–10.5)
nRBC: 0 % (ref 0.0–0.2)

## 2022-10-11 LAB — BRAIN NATRIURETIC PEPTIDE: B Natriuretic Peptide: 89.9 pg/mL (ref 0.0–100.0)

## 2022-10-11 LAB — BASIC METABOLIC PANEL
Anion gap: 5 (ref 5–15)
BUN: 9 mg/dL (ref 6–20)
CO2: 27 mmol/L (ref 22–32)
Calcium: 8.8 mg/dL — ABNORMAL LOW (ref 8.9–10.3)
Chloride: 106 mmol/L (ref 98–111)
Creatinine, Ser: 0.82 mg/dL (ref 0.44–1.00)
GFR, Estimated: >60 mL/min (ref >60)
Glucose, Bld: 94 mg/dL (ref 70–99)
Potassium: 3.9 mmol/L (ref 3.5–5.1)
Sodium: 138 mmol/L (ref 135–145)

## 2022-10-11 LAB — PREGNANCY, URINE: Preg Test, Ur: NEGATIVE

## 2022-10-11 MED ORDER — METHYLPREDNISOLONE SODIUM SUCC 125 MG IJ SOLR
125.0000 mg | Freq: Once | INTRAMUSCULAR | Status: AC
  Administered 2022-10-11: 125 mg via INTRAVENOUS
  Filled 2022-10-11: qty 2

## 2022-10-11 MED ORDER — ALBUTEROL SULFATE HFA 108 (90 BASE) MCG/ACT IN AERS: 2.0000 | INHALATION_SPRAY | RESPIRATORY_TRACT | Status: DC | PRN

## 2022-10-11 MED ORDER — MAGNESIUM SULFATE 2 GM/50ML IV SOLN
2.0000 g | Freq: Once | INTRAVENOUS | Status: AC
Start: 2022-10-11 — End: 2022-10-11
  Administered 2022-10-11: 2 g via INTRAVENOUS
  Filled 2022-10-11: qty 50

## 2022-10-11 MED ORDER — IPRATROPIUM-ALBUTEROL 0.5-2.5 (3) MG/3ML IN SOLN
3.0000 mL | Freq: Once | RESPIRATORY_TRACT | Status: AC
Start: 2022-10-11 — End: 2022-10-11
  Administered 2022-10-11: 3 mL via RESPIRATORY_TRACT
  Filled 2022-10-11: qty 3

## 2022-10-11 MED ORDER — PREDNISONE 10 MG (21) PO TBPK: ORAL_TABLET | Freq: Every day | ORAL | 0 refills | Status: DC

## 2022-10-11 MED ORDER — SODIUM CHLORIDE 0.9 % IV BOLUS
1000.0000 mL | Freq: Once | INTRAVENOUS | Status: AC
Start: 2022-10-11 — End: 2022-10-11
  Administered 2022-10-11: 1000 mL via INTRAVENOUS

## 2022-10-11 MED ORDER — VENTOLIN HFA 108 (90 BASE) MCG/ACT IN AERS: 1.0000 | INHALATION_SPRAY | RESPIRATORY_TRACT | 1 refills | Status: DC | PRN

## 2022-10-11 MED ORDER — PANTOPRAZOLE SODIUM 20 MG PO TBEC: 20.0000 mg | DELAYED_RELEASE_TABLET | Freq: Every day | ORAL | 0 refills | Status: DC
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
## 2022-10-11 NOTE — ED Notes (Signed)
Pt.'s son comes outside of the triage room stating "what the fuck are y'all doing. My mom is sick. Pt. Yells at nurse and triage tech, "y'all need to come in here now." Security called to escort visitor outside of the triage room.

## 2022-10-11 NOTE — ED Triage Notes (Signed)
Pt reports last waking up approx 9pm and when walking back from [place]. Chest tightness/ SOB/cough. Used inhaler at home without relief and went to ED and not given anything other than inhaler

## 2022-10-11 NOTE — Discharge Instructions (Addendum)
It was a pleasure caring for you today in the emergency department.  Please return to the emergency department for any worsening or worrisome symptoms.  Please follow-up with your asthma specialist.  Please avoid exposure to known triggers such as strong smells, smoke, perfumes.

## 2022-10-11 NOTE — ED Triage Notes (Addendum)
Pt. Arrives POV for an asthma attack. Pt. States that she has tried to use her inhaler, but has not had any relief. Pt. Endorses chest tightness and back pain.

## 2022-10-11 NOTE — ED Notes (Signed)
Pt. States that she is having a hard time breathing and that she needs a breathing treatment. Provider notified.

## 2022-10-11 NOTE — ED Provider Notes (Signed)
[place] [facility] AT [facility] Provider Note  CSN: [id number] Arrival date & time: 10/11/22 0841  Chief Complaint(s) Asthma  HPI Darlene Logan is a 47 y.o. female with past medical history as below, significant for asthma, anemia, obesity,  who presents to the ED with complaint of dib. Symptoms ongoing last 24 hours, unrelieved with home albuterol inhaler, no other asthma/controller medications. Went to [facility] last night but left 2/2 prolonged wait time of around an hour, went home and felt somewhat better. Woke up this morning with worsening symptoms. Nonproductive cough, dib. No fevers, chills, n/v, no cp or syncope/near syncope. Follows with asthma/allergy clinic. Stopped smoking   Past Medical History Past Medical History:  Diagnosis Date   Anemia    Asthma    Eczema    Obesity    Seasonal allergies    There are no problems to display for this patient.  Home Medication(s) Prior to Admission medications   Medication Sig Start Date End Date Taking? Authorizing Provider  albuterol (VENTOLIN HFA) 108 (90 Base) MCG/ACT inhaler Inhale 1-2 puffs into the lungs every 4 (four) hours as needed for wheezing or shortness of breath. 10/11/22  Yes Tanda Rockers A, DO  pantoprazole (PROTONIX) 20 MG tablet Take 1 tablet (20 mg total) by mouth daily for 14 days. 10/11/22 10/25/22 Yes Sloan Leiter, DO  predniSONE (STERAPRED UNI-PAK 21 TAB) 10 MG (21) TBPK tablet Take by mouth daily. Take 6 tabs by mouth daily  for 2 days, then 5 tabs for 2 days, then 4 tabs for 2 days, then 3 tabs for 2 days, 2 tabs for 2 days, then 1 tab by mouth daily for 2 days 10/11/22  Yes Tanda Rockers A, DO  ADVAIR DISKUS 250-50 MCG/DOSE AEPB Inhale 1 puff into the lungs 2 (two) times daily. 06/11/20   [provider]  cetirizine (ZYRTEC ALLERGY) 10 MG tablet Take 1 tablet (10 mg total) by mouth daily. 08/10/19   Dahlia Byes A, NP  ciprofloxacin (CIPRO) 500 MG tablet Take 1 tablet by mouth every 12  (twelve) hours. 07/09/20   [provider]  dicyclomine (BENTYL) 20 MG tablet Take 20 mg by mouth 4 (four) times daily. 07/09/20   [provider]  diphenhydrAMINE-zinc acetate (BENADRYL EXTRA STRENGTH) cream Apply 1 application topically 3 (three) times daily as needed for itching. 04/24/21   Charlynne Pander, MD  famotidine (PEPCID) 20 MG tablet Take 1 tablet (20 mg total) by mouth 2 (two) times daily. 06/20/20   Wallis Bamberg, PA-C  ferrous sulfate 325 (65 FE) MG tablet Take 325 mg by mouth daily with breakfast.    [provider]  fluticasone (FLONASE) 50 MCG/ACT nasal spray Place 2 sprays into both nostrils daily. 12/07/18   Joy, Shawn C, PA-C  methocarbamol (ROBAXIN) 500 MG tablet Take 1 tablet (500 mg total) by mouth 2 (two) times daily. 08/13/20   Dartha Lodge, PA-C  naproxen (NAPROSYN) 500 MG tablet Take 1 tablet (500 mg total) by mouth 2 (two) times daily. 08/13/20   Dartha Lodge, PA-C  triamcinolone cream (KENALOG) 0.1 % Apply 1 application topically 2 (two) times daily. 12/06/20   Dietrich Pates, PA-C  Past Surgical History Past Surgical History:  Procedure Laterality Date   NO PAST SURGERIES     Family History Family History  Problem Relation Age of Onset   Hypertension Maternal Aunt    HIV/AIDS Mother    Diabetes Sister    Diabetes Maternal Aunt     Social History Social History   Tobacco Use   Smoking status: Former    Current packs/day: 0.00    Types: Cigarettes    Quit date: 07/12/2007    Years since quitting: 15.2   Smokeless tobacco: Never  Vaping Use   Vaping status: Never Used  Substance Use Topics   Alcohol use: Yes    Comment: occasional   Drug use: Never   Allergies Other, Loratadine, Peanut-containing drug products, Shellfish allergy, Egg-derived products, Pecan nut (diagnostic), Coconut (cocos nucifera),  Coconut fatty acids, and Egg white (egg protein)  Review of Systems Review of Systems  Constitutional:  Negative for chills and fever.  Respiratory:  Positive for cough and shortness of breath.   Cardiovascular:  Negative for chest pain.  Gastrointestinal:  Negative for abdominal pain and nausea.  Genitourinary:  Negative for difficulty urinating.  Skin:  Negative for rash.  Neurological:  Negative for syncope.  All other systems reviewed and are negative.   Physical Exam Vital Signs  I have reviewed the triage vital signs BP (!) 148/78   Pulse 86   Temp 98.7 F (37.1 C)   Resp 10   Wt 98.4 kg   LMP 10/10/2022   SpO2 98%   BMI 35.02 kg/m  Physical Exam Vitals and nursing note reviewed.  Constitutional:      General: She is not in acute distress.    Appearance: Normal appearance. She is obese. She is not ill-appearing or diaphoretic.  HENT:     Head: Normocephalic and atraumatic.     Right Ear: External ear normal.     Left Ear: External ear normal.     Nose: Nose normal.     Mouth/Throat:     Mouth: Mucous membranes are moist.  Eyes:     General: No scleral icterus.       Right eye: No discharge.        Left eye: No discharge.  Cardiovascular:     Rate and Rhythm: Normal rate and regular rhythm.     Pulses: Normal pulses.     Heart sounds: Normal heart sounds.  Pulmonary:     Effort: Pulmonary effort is normal. No respiratory distress.     Breath sounds: No stridor. Decreased breath sounds and wheezing present.     Comments: Trace wheezing throughout  Abdominal:     General: Abdomen is flat. There is no distension.     Palpations: Abdomen is soft.     Tenderness: There is no abdominal tenderness.  Musculoskeletal:     Cervical back: No rigidity.     Right lower leg: No edema.     Left lower leg: No edema.  Skin:    General: Skin is warm and dry.     Capillary Refill: Capillary refill takes less than 2 seconds.  Neurological:     Mental Status: She is  alert.  Psychiatric:        Mood and Affect: Mood normal.        Behavior: Behavior normal. Behavior is cooperative.     ED Results and Treatments Labs (all labs ordered are listed, but only abnormal results are displayed) Labs Reviewed  BASIC  METABOLIC PANEL - Abnormal; Notable for the following components:      Result Value   Calcium 8.8 (*)    All other components within normal limits  CBC WITH DIFFERENTIAL/PLATELET - Abnormal; Notable for the following components:   Hemoglobin 9.1 (*)    HCT 30.3 (*)    MCV 72.5 (*)    MCH 21.8 (*)    RDW 18.2 (*)    All other components within normal limits  BRAIN NATRIURETIC PEPTIDE  PREGNANCY, URINE                                                                                                                          Radiology DG Chest Port 1 View  Result Date: 10/11/2022 CLINICAL DATA:  47 year old female with difficulty breathing. History of asthma. EXAM: PORTABLE CHEST 1 VIEW COMPARISON:  Chest radiographs 06/10/2022 and earlier. FINDINGS: Portable AP upright view at 0928 hours. Stable large lung volumes. Mediastinal contours remain within normal limits. Visualized tracheal air column is within normal limits. Allowing for portable technique the lungs are clear. No pneumothorax or pleural effusion. No acute osseous abnormality identified. Negative visible bowel gas. IMPRESSION: Suspicion of chronic pulmonary hyperinflation. No acute cardiopulmonary abnormality. Electronically Signed   By: Odessa Fleming M.D.   On: 10/11/2022 10:00    Pertinent labs & imaging results that were available during my care of the patient were reviewed by me and considered in my medical decision making (see MDM for details).  Medications Ordered in ED Medications  ipratropium-albuterol (DUONEB) 0.5-2.5 (3) MG/3ML nebulizer solution 3 mL (3 mLs Nebulization Given 10/11/22 0924)  sodium chloride 0.9 % bolus 1,000 mL (1,000 mLs Intravenous New Bag/Given 10/11/22 0956)   magnesium sulfate IVPB 2 g 50 mL (2 g Intravenous New Bag/Given 10/11/22 0958)  methylPREDNISolone sodium succinate (SOLU-MEDROL) 125 mg/2 mL injection 125 mg (125 mg Intravenous Given 10/11/22 0957)                                                                                                                                     Procedures Procedures  (including critical care time)  Medical Decision Making / ED Course    Medical Decision Making:    Darlene Logan is a 47 y.o. female with past medical history as below, significant for asthma, anemia, obesity,  who presents to the ED with complaint of dib. . The complaint  involves an extensive differential diagnosis and also carries with it a high risk of complications and morbidity.  Serious etiology was considered. Ddx includes but is not limited to: In my evaluation of this patient's dyspnea my DDx includes, but is not limited to, pneumonia, pulmonary embolism, pneumothorax, pulmonary edema, metabolic acidosis, asthma, COPD, cardiac cause, anemia, anxiety, etc.    Complete initial physical exam performed, notably the patient  was sitting upright, no hypoxia, wheezing noted, reduced air movement.    Reviewed and confirmed nursing documentation for past medical history, family history, social history.  Vital signs reviewed.   Pt here with dib/concern for asthma exacerbation. On RA, will get screening labs, duoneb/steroids/mag. CXR. recheck Clinical Course as of 10/11/22 1149  Sun Oct 11, 2022  1024 Hemoglobin(!): 9.1 Similar to prev [SG]    Clinical Course User Index [SG] Sloan Leiter, DO   She is feeling better on recheck, wheezing has resolved.  No hypoxia.  Labs stable.  X-ray stable.  Concern for asthma exacerbation.  Given refill on albuterol inhaler, steroids for home, encouraged her to take antihistamine daily, will start Protonix given indigestion triad be contributing to her asthma.  Advised she follow-up with asthma  clinic  The patient improved significantly and was discharged in stable condition. Detailed discussions were had with the patient regarding current findings, and need for close f/u with PCP or on call doctor. The patient has been instructed to return immediately if the symptoms worsen in any way for re-evaluation. Patient verbalized understanding and is in agreement with current care plan. All questions answered prior to discharge.          Additional history obtained: -Additional history obtained from na -External records from outside source obtained and reviewed including: Chart review including previous notes, labs, imaging, consultation notes including  Prior ed visits, home meds, prior labs/imaging   Lab Tests: -I ordered, reviewed, and interpreted labs.   The pertinent results include:   Labs Reviewed  BASIC METABOLIC PANEL - Abnormal; Notable for the following components:      Result Value   Calcium 8.8 (*)    All other components within normal limits  CBC WITH DIFFERENTIAL/PLATELET - Abnormal; Notable for the following components:   Hemoglobin 9.1 (*)    HCT 30.3 (*)    MCV 72.5 (*)    MCH 21.8 (*)    RDW 18.2 (*)    All other components within normal limits  BRAIN NATRIURETIC PEPTIDE  PREGNANCY, URINE    Notable for hgb low, similar to prior  EKG   EKG Interpretation Date/Time:  Sunday October 11 2022 09:42:01 EDT Ventricular Rate:  85 PR Interval:  147 QRS Duration:  91 QT Interval:  365 QTC Calculation: 434 R Axis:   48  Text Interpretation: Sinus rhythm no stemi Confirmed by Tanda Rockers ([id number]) on 10/11/2022 10:21:49 AM         Imaging Studies ordered: I ordered imaging studies including cxr I independently visualized the following imaging with scope of interpretation limited to determining acute life threatening conditions related to emergency care; findings noted above, significant for hyperinflation, no ptx or pna, agree w/ radiologist  I  independently visualized and interpreted imaging. I agree with the radiologist interpretation   Medicines ordered and prescription drug management: Meds ordered this encounter  Medications   ipratropium-albuterol (DUONEB) 0.5-2.5 (3) MG/3ML nebulizer solution 3 mL   sodium chloride 0.9 % bolus 1,000 mL   magnesium sulfate IVPB 2 g 50 mL  methylPREDNISolone sodium succinate (SOLU-MEDROL) 125 mg/2 mL injection 125 mg   pantoprazole (PROTONIX) 20 MG tablet    Sig: Take 1 tablet (20 mg total) by mouth daily for 14 days.    Dispense:  14 tablet    Refill:  0   predniSONE (STERAPRED UNI-PAK 21 TAB) 10 MG (21) TBPK tablet    Sig: Take by mouth daily. Take 6 tabs by mouth daily  for 2 days, then 5 tabs for 2 days, then 4 tabs for 2 days, then 3 tabs for 2 days, 2 tabs for 2 days, then 1 tab by mouth daily for 2 days    Dispense:  42 tablet    Refill:  0   albuterol (VENTOLIN HFA) 108 (90 Base) MCG/ACT inhaler    Sig: Inhale 1-2 puffs into the lungs every 4 (four) hours as needed for wheezing or shortness of breath.    Dispense:  18 g    Refill:  1    -I have reviewed the patients home medicines and have made adjustments as needed   Consultations Obtained: na   Cardiac Monitoring: The patient was maintained on a cardiac monitor.  I personally viewed and interpreted the cardiac monitored which showed an underlying rhythm of: NSR  Social Determinants of Health:  Diagnosis or treatment significantly limited by social determinants of health: former smoker and obesity   Reevaluation: After the interventions noted above, I reevaluated the patient and found that they have improved  Co morbidities that complicate the patient evaluation  Past Medical History:  Diagnosis Date   Anemia    Asthma    Eczema    Obesity    Seasonal allergies       Dispostion: Disposition decision including need for hospitalization was considered, and patient discharged from emergency  department.    Final Clinical Impression(s) / ED Diagnoses Final diagnoses:  Mild intermittent asthma with exacerbation     This chart was dictated using voice recognition software.  Despite best efforts to proofread,  errors can occur which can change the documentation meaning.    Sloan Leiter, DO 10/11/22 1149

## 2022-10-11 NOTE — ED Notes (Signed)
Mag running as secondary over , NSS bolus to finish after.

## 2022-11-03 IMAGING — DX DG CHEST 1V PORT
1 series · 1 of 1 positions shown · non-contrast
Comparison: 01/04/2021

CLINICAL DATA: Cough and chest congestion for 3 days.

EXAM:
PORTABLE CHEST 1 VIEW

[chest ap]
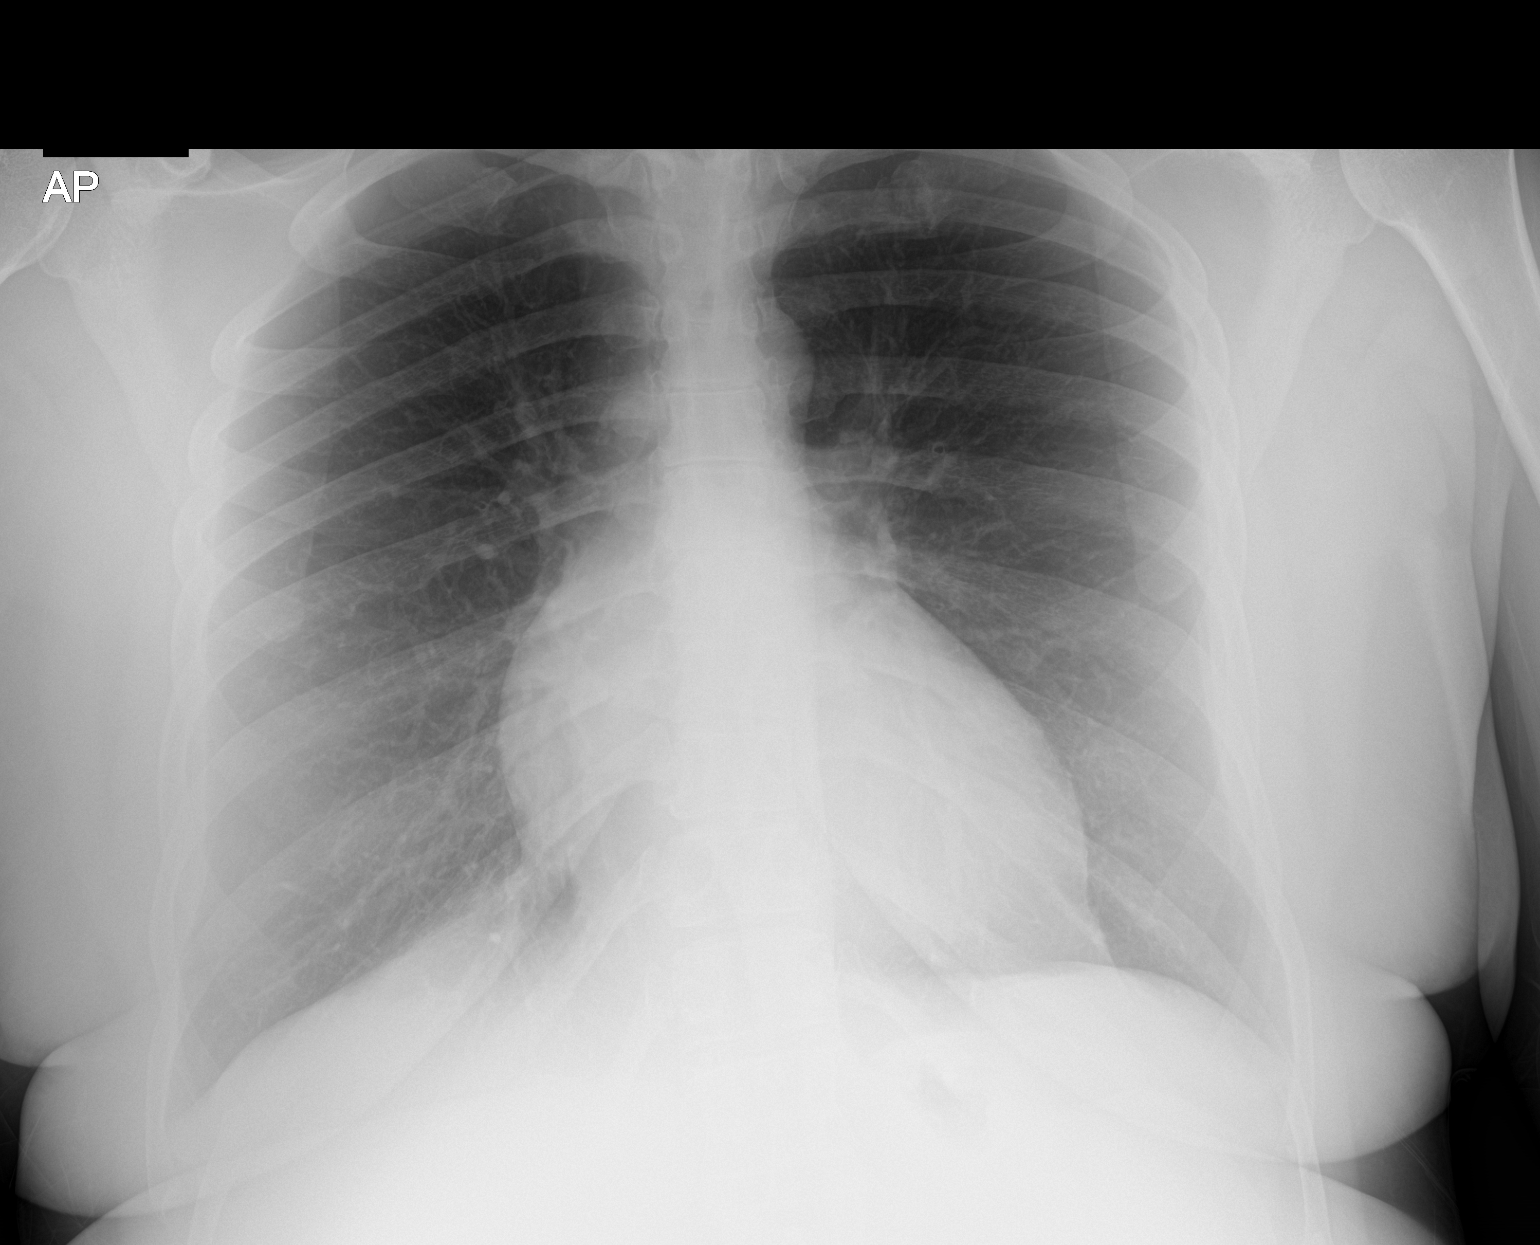

[1 of 1 positions shown; findings below may reference images not displayed]

FINDINGS: The heart size and mediastinal contours are within normal limits.
Both lungs are clear. The visualized skeletal structures are
unremarkable.
IMPRESSION: No active disease.

## 2023-02-20 ENCOUNTER — Encounter (HOSPITAL_BASED_OUTPATIENT_CLINIC_OR_DEPARTMENT_OTHER): Payer: Self-pay

## 2023-02-20 ENCOUNTER — Emergency Department (HOSPITAL_BASED_OUTPATIENT_CLINIC_OR_DEPARTMENT_OTHER)
Admission: EM | Admit: 2023-02-20 | Discharge: 2023-02-20 | Disposition: A | Discharge status: Home / Self Care | Payer: Medicaid Other | Attending: Emergency Medicine | Admitting: Emergency Medicine

## 2023-02-20 DIAGNOSIS — D649 Anemia, unspecified: Secondary | ICD-10-CM | POA: Insufficient documentation

## 2023-02-20 DIAGNOSIS — J4541 Moderate persistent asthma with (acute) exacerbation: Secondary | ICD-10-CM | POA: Insufficient documentation

## 2023-02-20 DIAGNOSIS — Z91010 Allergy to peanuts: Secondary | ICD-10-CM | POA: Diagnosis not present

## 2023-02-20 DIAGNOSIS — Z7951 Long term (current) use of inhaled steroids: Secondary | ICD-10-CM | POA: Insufficient documentation

## 2023-02-20 DIAGNOSIS — R0602 Shortness of breath: Secondary | ICD-10-CM | POA: Diagnosis present

## 2023-02-20 LAB — CBC WITH DIFFERENTIAL/PLATELET
Abs Immature Granulocytes: 0.01 10*3/uL (ref 0.00–0.07)
Basophils Absolute: 0 10*3/uL (ref 0.0–0.1)
Basophils Relative: 1 %
Eosinophils Absolute: 0.3 10*3/uL (ref 0.0–0.5)
Eosinophils Relative: 6 %
HCT: 28.5 % — ABNORMAL LOW (ref 36.0–46.0)
Hemoglobin: 8.4 g/dL — ABNORMAL LOW (ref 12.0–15.0)
Immature Granulocytes: 0 %
Lymphocytes Relative: 30 %
Lymphs Abs: 1.4 10*3/uL (ref 0.7–4.0)
MCH: 20.6 pg — ABNORMAL LOW (ref 26.0–34.0)
MCHC: 29.5 g/dL — ABNORMAL LOW (ref 30.0–36.0)
MCV: 70 fL — ABNORMAL LOW (ref 80.0–100.0)
Monocytes Absolute: 0.4 10*3/uL (ref 0.1–1.0)
Monocytes Relative: 9 %
Neutro Abs: 2.5 10*3/uL (ref 1.7–7.7)
Neutrophils Relative %: 54 %
Platelets: 392 10*3/uL (ref 150–400)
RBC: 4.07 MIL/uL (ref 3.87–5.11)
RDW: 16.3 % — ABNORMAL HIGH (ref 11.5–15.5)
WBC: 4.6 10*3/uL (ref 4.0–10.5)
nRBC: 0 % (ref 0.0–0.2)

## 2023-02-20 LAB — COMPREHENSIVE METABOLIC PANEL
ALT: 21 U/L (ref 0–44)
AST: 24 U/L (ref 15–41)
Albumin: 3.7 g/dL (ref 3.5–5.0)
Alkaline Phosphatase: 77 U/L (ref 38–126)
Anion gap: 10 (ref 5–15)
BUN: 11 mg/dL (ref 6–20)
CO2: 21 mmol/L — ABNORMAL LOW (ref 22–32)
Calcium: 8.9 mg/dL (ref 8.9–10.3)
Chloride: 104 mmol/L (ref 98–111)
Creatinine, Ser: 0.94 mg/dL (ref 0.44–1.00)
GFR, Estimated: >60 mL/min (ref >60)
Glucose, Bld: 127 mg/dL — ABNORMAL HIGH (ref 70–99)
Potassium: 4 mmol/L (ref 3.5–5.1)
Sodium: 135 mmol/L (ref 135–145)
Total Bilirubin: 0.4 mg/dL (ref <1.2)
Total Protein: 7.3 g/dL (ref 6.5–8.1)

## 2023-02-20 LAB — D-DIMER, QUANTITATIVE: D-Dimer, Quant: 0.45 ug{FEU}/mL (ref 0.00–0.50)

## 2023-02-20 LAB — PREGNANCY, URINE: Preg Test, Ur: NEGATIVE

## 2023-02-20 LAB — BRAIN NATRIURETIC PEPTIDE: B Natriuretic Peptide: 68.8 pg/mL (ref 0.0–100.0)

## 2023-02-20 LAB — TROPONIN I (HIGH SENSITIVITY): Troponin I (High Sensitivity): 4 ng/L (ref <18)

## 2023-02-20 MED ORDER — PREDNISONE 50 MG PO TABS
60.0000 mg | ORAL_TABLET | Freq: Once | ORAL | Status: AC
  Administered 2023-02-20: 60 mg via ORAL
  Filled 2023-02-20: qty 1

## 2023-02-20 MED ORDER — PREDNISONE 10 MG PO TABS: 40.0000 mg | ORAL_TABLET | Freq: Every day | ORAL | 0 refills | Status: AC

## 2023-02-20 MED ORDER — ALBUTEROL SULFATE (2.5 MG/3ML) 0.083% IN NEBU
2.5000 mg | INHALATION_SOLUTION | Freq: Once | RESPIRATORY_TRACT | Status: AC
  Administered 2023-02-20: 2.5 mg via RESPIRATORY_TRACT
  Filled 2023-02-20: qty 3

## 2023-02-20 MED ORDER — ALBUTEROL SULFATE HFA 108 (90 BASE) MCG/ACT IN AERS
2.0000 | INHALATION_SPRAY | Freq: Once | RESPIRATORY_TRACT | Status: AC
  Administered 2023-02-20: 2 via RESPIRATORY_TRACT
  Filled 2023-02-20: qty 6.7

## 2023-02-20 NOTE — ED Provider Notes (Signed)
[place] [facility] Provider Note   CSN: [id number] Arrival date & time: 02/20/23  6644     History  Chief Complaint  Patient presents with   Shortness of Breath    Darlene Logan is a 47 y.o. female.  HPI    47 year old female with a history of asthma, eczema, anemia presents with concern for shortness of breath.  Reports she has been feeling short of breath over the last week.  She been using her inhaler so much that she ran out of it.  The shortness of breath is typically at a 5 but has been more out of 7.  Denies any chest pain, fever.  Has been having some sneezing and has a dry cough.  Denies nausea, vomiting, leg pain or swelling.  No sore throat.  No long trips in car airplane.  No recent surgeries.  Not on estrogen.  No family history of DVT or PE.  Denies possibility of being pregnant.  Reports she has had some lightheadedness that comes and goes.  She saw her doctor on Wednesday who performed a chest x-ray and said there was inflammation, but did not prescribe her anything.    Past Medical History:  Diagnosis Date   Anemia    Asthma    Eczema    Obesity    Seasonal allergies      Home Medications Prior to Admission medications   Medication Sig Start Date End Date Taking? Authorizing Provider  predniSONE (DELTASONE) 10 MG tablet Take 4 tablets (40 mg total) by mouth daily for 4 days. 02/20/23 02/24/23 Yes Samera Macy, Denny Peon, MD  ADVAIR DISKUS 250-50 MCG/DOSE AEPB Inhale 1 puff into the lungs 2 (two) times daily. 06/11/20   [provider]  albuterol (VENTOLIN HFA) 108 (90 Base) MCG/ACT inhaler Inhale 1-2 puffs into the lungs every 4 (four) hours as needed for wheezing or shortness of breath. 10/11/22   Sloan Leiter, DO  cetirizine (ZYRTEC ALLERGY) 10 MG tablet Take 1 tablet (10 mg total) by mouth daily. 08/10/19   Dahlia Byes A, NP  ciprofloxacin (CIPRO) 500 MG tablet Take 1 tablet by mouth every 12 (twelve) hours.  07/09/20   [provider]  dicyclomine (BENTYL) 20 MG tablet Take 20 mg by mouth 4 (four) times daily. 07/09/20   [provider]  diphenhydrAMINE-zinc acetate (BENADRYL EXTRA STRENGTH) cream Apply 1 application topically 3 (three) times daily as needed for itching. 04/24/21   Charlynne Pander, MD  famotidine (PEPCID) 20 MG tablet Take 1 tablet (20 mg total) by mouth 2 (two) times daily. 06/20/20   Wallis Bamberg, PA-C  ferrous sulfate 325 (65 FE) MG tablet Take 325 mg by mouth daily with breakfast.    [provider]  fluticasone (FLONASE) 50 MCG/ACT nasal spray Place 2 sprays into both nostrils daily. 12/07/18   Joy, Shawn C, PA-C  methocarbamol (ROBAXIN) 500 MG tablet Take 1 tablet (500 mg total) by mouth 2 (two) times daily. 08/13/20   Simeon Craft, PA-C  naproxen (NAPROSYN) 500 MG tablet Take 1 tablet (500 mg total) by mouth 2 (two) times daily. 08/13/20   Simeon Craft, PA-C  pantoprazole (PROTONIX) 20 MG tablet Take 1 tablet (20 mg total) by mouth daily for 14 days. 10/11/22 10/25/22  Tanda Rockers A, DO  triamcinolone cream (KENALOG) 0.1 % Apply 1 application topically 2 (two) times daily. 12/06/20   Khatri, Hillary Bow, PA-C      Allergies    Other,  Loratadine, Peanut-containing drug products, Shellfish allergy, Egg-derived products, Pecan nut (diagnostic), Coconut (cocos nucifera), Coconut fatty acid, and Egg white (egg protein)    Review of Systems   Review of Systems  Physical Exam Updated Vital Signs BP 122/89 (BP Location: Right Arm)   Pulse 94   Temp (!) 97 F (36.1 C)   Resp 18   Ht 5\' 6"  (1.676 m)   Wt 123.4 kg   LMP 01/29/2023 (Approximate)   SpO2 99%   BMI 43.90 kg/m  Physical Exam Vitals and nursing note reviewed.  Constitutional:      General: She is not in acute distress.    Appearance: She is well-developed. She is not diaphoretic.  HENT:     Head: Normocephalic and atraumatic.  Eyes:     Conjunctiva/sclera: Conjunctivae normal.   Cardiovascular:     Rate and Rhythm: Normal rate and regular rhythm.     Heart sounds: Normal heart sounds. No murmur heard.    No friction rub. No gallop.  Pulmonary:     Effort: Pulmonary effort is normal. No respiratory distress.     Breath sounds: Normal breath sounds. No wheezing or rales.  Abdominal:     General: There is no distension.     Palpations: Abdomen is soft.     Tenderness: There is no abdominal tenderness. There is no guarding.  Musculoskeletal:        General: No tenderness.     Cervical back: Normal range of motion.  Skin:    General: Skin is warm and dry.     Findings: No erythema or rash.  Neurological:     Mental Status: She is alert and oriented to person, place, and time.     ED Results / Procedures / Treatments   Labs (all labs ordered are listed, but only abnormal results are displayed) Labs Reviewed  CBC WITH DIFFERENTIAL/PLATELET - Abnormal; Notable for the following components:      Result Value   Hemoglobin 8.4 (*)    HCT 28.5 (*)    MCV 70.0 (*)    MCH 20.6 (*)    MCHC 29.5 (*)    RDW 16.3 (*)    All other components within normal limits  COMPREHENSIVE METABOLIC PANEL - Abnormal; Notable for the following components:   CO2 21 (*)    Glucose, Bld 127 (*)    All other components within normal limits  BRAIN NATRIURETIC PEPTIDE  D-DIMER, QUANTITATIVE  PREGNANCY, URINE  TROPONIN I (HIGH SENSITIVITY)    EKG EKG Interpretation Date/Time:  Saturday February 20 2023 07:43:24 EST Ventricular Rate:  78 PR Interval:  142 QRS Duration:  92 QT Interval:  373 QTC Calculation: 425 R Axis:   65  Text Interpretation: Sinus rhythm No significant change since last tracing Confirmed by Alvira Monday ([id number]) on 02/20/2023 8:09:35 AM  Radiology No results found.  Procedures Procedures    Medications Ordered in ED Medications  predniSONE (DELTASONE) tablet 60 mg (has no administration in time range)  albuterol (VENTOLIN HFA) 108 (90  Base) MCG/ACT inhaler 2 puff (has no administration in time range)  albuterol (PROVENTIL) (2.5 MG/3ML) 0.083% nebulizer solution 2.5 mg (2.5 mg Nebulization Given 02/20/23 0750)    ED Course/ Medical Decision Making/ A&P                                   47 year old female with a history of asthma, eczema,  anemia presents with concern for shortness of breath.  Differential diagnosis for dyspnea includes ACS, PE, asthma exacerbation, CHF exacerbation, anemia, pneumonia, viral etiology such as COVID 19 infection, metabolic abnormality.    EKG was completed and personally about interpreted by me showed no evidence of acute abnormalities.  Labs completed and personally about interpreted by me show hemoglobin of 8.4 which is similar to prior values, no clinically significant electrolyte abnormalities.  Troponin is normal and have low suspicion for ACS.  D-dimer is negative and she is low risk [place] and have low suspicion for PE.  BNP is 68.8 and have a low suspicion for congestive heart failure as etiology of symptoms.  Pregnancy test is negative.  She reports having chest x-ray done 2 days ago which did not show signs of pneumonia, pneumothorax, or pulmonary edema, reports that her provider told her it had "inflammation."  I have low suspicion at this time for pneumonia/pulmonary edema/pneumothorax as etiology of dyspnea. Suspect most likely asthma exacerbation.  While she does not have significant wheezing on exam, she feels significant improvement with treatment.  She is not tachypneic or in distress, is stable for outpatient treatment.  She has iron tablets from PCP and will follow up with her regarding her anemia and asthma.  Given albuterol inhaler, prescription for prednisone.  Patient discharged in stable condition with understanding of reasons to return.          Final Clinical Impression(s) / ED Diagnoses Final diagnoses:  Moderate persistent asthma with exacerbation  Anemia,  unspecified type    Rx / DC Orders ED Discharge Orders          Ordered    predniSONE (DELTASONE) 10 MG tablet  Daily        02/20/23 0926              Alvira Monday, MD 02/20/23 [phone]

## 2023-02-20 NOTE — ED Notes (Signed)
RT Note: Patient had HX Asthma and was saying she could not catch her breath, or breath deep. She felt tight in her chest. A breathing treatment was given

## 2023-02-20 NOTE — ED Triage Notes (Signed)
Pt reports that she has hx of asthma.States that she started having a flare up yesterday and noticed some SOB.

## 2023-02-20 NOTE — ED Notes (Signed)
RT Note: Albuterol Inhaler given for home use

## 2023-06-06 NOTE — Progress Notes (Deleted)
 [facility] CONSULT NOTE  Patient Care Team: [name], MD as PCP - General (Internal Medicine)  ASSESSMENT & PLAN:  @AGE  female with history of *** being seen for iron deficiency anemia.  No problem-specific Assessment & Plan notes found for this encounter.  No orders of the defined types were placed in this encounter.   Relevant history: History of *** Last colonoscopy: Last EGD: Periods:   The mechanism of IDA is due to either blood loss or decreased absorptive mechanism or both. We discussed some of the risks, benefits, and alternatives of intravenous iron infusions. The patient is symptomatic from anemia and the iron level is critically low. She tolerated oral iron supplement poorly and desires to achieved higher levels of iron faster for adequate hematopoesis. Some of the side-effects to be expected including risks of infusion reactions, phlebitis, headaches, nausea and fatigue.  The patient is willing to proceed. Patient education material was dispensed.   IV iron *** ordered. CBC, Iron, TIBC, ferritin Referral for colonoscopy and EGD  All questions were answered. The patient knows to call the clinic with any problems, questions or concerns.  Melven Sartorius, MD 3/9/202510:54 PM   CHIEF COMPLAINTS/PURPOSE OF CONSULTATION:  Anemia  HISTORY OF PRESENTING ILLNESS:  Darlene Logan 48 y.o. female is here because of anemia.  05/11/22 ferritin 3. Hgb 8.8 MCV 70 plt 421.  Fred had not noticed any recent bleeding such as melena, hematuria or hematochezia Her last colonoscopy was  ***She had no prior history or diagnosis of cancer. Her age appropriate screening programs are up-to-date. ***She denies any pica and eats a variety of diet. ***She denies blood donation or received blood transfusion ***The patient was prescribed oral iron supplements and she takes ***  MEDICAL HISTORY:  Past Medical History:  Diagnosis Date   Anemia    Asthma     Eczema    Obesity    Seasonal allergies     SURGICAL HISTORY: Past Surgical History:  Procedure Laterality Date   NO PAST SURGERIES      SOCIAL HISTORY: Social History   Socioeconomic History   Marital status: Single    Spouse name: Not on file   Number of children: 5   Years of education: Not on file   Highest education level: Not on file  Occupational History   Occupation: [employer]  Tobacco Use   Smoking status: Former    Current packs/day: 0.00    Types: Cigarettes    Quit date: 07/12/2007    Years since quitting: 15.9   Smokeless tobacco: Never  Vaping Use   Vaping status: Never Used  Substance and Sexual Activity   Alcohol use: Yes    Comment: occasional   Drug use: Never   Sexual activity: Not on file  Other Topics Concern   Not on file  Social History Narrative   Not on file   Social Drivers of Health   Financial Resource Strain: Not on file  Food Insecurity: Not on file  Transportation Needs: Not on file  Physical Activity: Not on file  Stress: Not on file  Social Connections: Unknown (08/12/2021)   Received from [facility], [facility]   Social Network    Social Network: Not on file  Intimate Partner Violence: Unknown (07/03/2021)   Received from [facility], [facility]   HITS    Physically Hurt: Not on file    Insult or Talk Down To: Not on file    Threaten Physical Harm: Not  on file    Scream or Curse: Not on file    FAMILY HISTORY: Family History  Problem Relation Age of Onset   Hypertension Maternal Aunt    HIV/AIDS Mother    Diabetes Sister    Diabetes Maternal Aunt     ALLERGIES:  is allergic to other, loratadine, peanut-containing drug products, shellfish allergy, egg-derived products, pecan nut (diagnostic), coconut (cocos nucifera), coconut fatty acid, and egg white (egg protein).  MEDICATIONS:  Current Outpatient Medications  Medication Sig Dispense Refill   ADVAIR DISKUS 250-50 MCG/DOSE AEPB Inhale 1 puff into the lungs 2  (two) times daily.     albuterol (VENTOLIN HFA) 108 (90 Base) MCG/ACT inhaler Inhale 1-2 puffs into the lungs every 4 (four) hours as needed for wheezing or shortness of breath. 18 g 1   cetirizine (ZYRTEC ALLERGY) 10 MG tablet Take 1 tablet (10 mg total) by mouth daily. 30 tablet 2   ciprofloxacin (CIPRO) 500 MG tablet Take 1 tablet by mouth every 12 (twelve) hours.     dicyclomine (BENTYL) 20 MG tablet Take 20 mg by mouth 4 (four) times daily.     diphenhydrAMINE-zinc acetate (BENADRYL EXTRA STRENGTH) cream Apply 1 application topically 3 (three) times daily as needed for itching. 28.4 g 0   famotidine (PEPCID) 20 MG tablet Take 1 tablet (20 mg total) by mouth 2 (two) times daily. 60 tablet 0   ferrous sulfate 325 (65 FE) MG tablet Take 325 mg by mouth daily with breakfast.     fluticasone (FLONASE) 50 MCG/ACT nasal spray Place 2 sprays into both nostrils daily. 16 g 2   methocarbamol (ROBAXIN) 500 MG tablet Take 1 tablet (500 mg total) by mouth 2 (two) times daily. 20 tablet 0   naproxen (NAPROSYN) 500 MG tablet Take 1 tablet (500 mg total) by mouth 2 (two) times daily. 30 tablet 0   pantoprazole (PROTONIX) 20 MG tablet Take 1 tablet (20 mg total) by mouth daily for 14 days. 14 tablet 0   triamcinolone cream (KENALOG) 0.1 % Apply 1 application topically 2 (two) times daily. 30 g 0   No current facility-administered medications for this visit.    REVIEW OF SYSTEMS:   All relevant systems were reviewed with the patient and are negative.  PHYSICAL EXAMINATION: ECOG PERFORMANCE STATUS: {CHL ONC ECOG [phone]}  There were no vitals filed for this visit. There were no vitals filed for this visit.  GENERAL: alert, no distress and comfortable SKIN: skin color normal EYES: normal conjunctiva, sclera clear LUNGS: normal breathing effort HEART: regular rate & rhythm ABDOMEN: abdomen soft, non-tender and nondistended  RADIOGRAPHIC STUDIES: I have personally reviewed the radiological  images as listed and agreed with the findings in the report. No results found.

## 2023-06-07 ENCOUNTER — Inpatient Hospital Stay: Payer: Medicaid Other

## 2023-06-24 ENCOUNTER — Telehealth: Payer: Self-pay

## 2023-06-24 NOTE — Progress Notes (Deleted)
 [facility] CONSULT NOTE  Patient Care Team: [name], MD as PCP - General (Internal Medicine)  CHIEF COMPLAINTS/PURPOSE OF CONSULTATION:  IDA  ASSESSMENT & PLAN:  No problem-specific Assessment & Plan notes found for this encounter.  No orders of the defined types were placed in this encounter.    HISTORY OF PRESENTING ILLNESS:  Darlene Logan 48 y.o. female is here because of IDA    REVIEW OF SYSTEMS:   Constitutional: Denies fevers, chills or abnormal night sweats Eyes: Denies blurriness of vision, double vision or watery eyes Ears, nose, mouth, throat, and face: Denies mucositis or sore throat Respiratory: Denies cough, dyspnea or wheezes Cardiovascular: Denies palpitation, chest discomfort or lower extremity swelling Gastrointestinal:  Denies nausea, heartburn or change in bowel habits Skin: Denies abnormal skin rashes Lymphatics: Denies new lymphadenopathy or easy bruising Neurological:Denies numbness, tingling or new weaknesses Behavioral/Psych: Mood is stable, no new changes  All other systems were reviewed with the patient and are negative.  MEDICAL HISTORY:  Past Medical History:  Diagnosis Date   Anemia    Asthma    Eczema    Obesity    Seasonal allergies     SURGICAL HISTORY: Past Surgical History:  Procedure Laterality Date   NO PAST SURGERIES      SOCIAL HISTORY: Social History   Socioeconomic History   Marital status: Single    Spouse name: Not on file   Number of children: 5   Years of education: Not on file   Highest education level: Not on file  Occupational History   Occupation: [employer]  Tobacco Use   Smoking status: Former    Current packs/day: 0.00    Types: Cigarettes    Quit date: 07/12/2007    Years since quitting: 15.9   Smokeless tobacco: Never  Vaping Use   Vaping status: Never Used  Substance and Sexual Activity   Alcohol use: Yes    Comment: occasional   Drug use: Never   Sexual activity: Not on  file  Other Topics Concern   Not on file  Social History Narrative   Not on file   Social Drivers of Health   Financial Resource Strain: Not on file  Food Insecurity: Not on file  Transportation Needs: Not on file  Physical Activity: Not on file  Stress: Not on file  Social Connections: Unknown (08/12/2021)   Received from [facility], [facility]   Social Network    Social Network: Not on file  Intimate Partner Violence: Unknown (07/03/2021)   Received from [facility], [facility]   HITS    Physically Hurt: Not on file    Insult or Talk Down To: Not on file    Threaten Physical Harm: Not on file    Scream or Curse: Not on file    FAMILY HISTORY: Family History  Problem Relation Age of Onset   Hypertension Maternal Aunt    HIV/AIDS Mother    Diabetes Sister    Diabetes Maternal Aunt     ALLERGIES:  is allergic to other, loratadine, peanut-containing drug products, shellfish allergy, egg-derived products, pecan nut (diagnostic), coconut (cocos nucifera), coconut fatty acid, and egg white (egg protein).  MEDICATIONS:  Current Outpatient Medications  Medication Sig Dispense Refill   ADVAIR DISKUS 250-50 MCG/DOSE AEPB Inhale 1 puff into the lungs 2 (two) times daily.     albuterol (VENTOLIN HFA) 108 (90 Base) MCG/ACT inhaler Inhale 1-2 puffs into the lungs every 4 (four) hours as needed for  wheezing or shortness of breath. 18 g 1   cetirizine (ZYRTEC ALLERGY) 10 MG tablet Take 1 tablet (10 mg total) by mouth daily. 30 tablet 2   ciprofloxacin (CIPRO) 500 MG tablet Take 1 tablet by mouth every 12 (twelve) hours.     dicyclomine (BENTYL) 20 MG tablet Take 20 mg by mouth 4 (four) times daily.     diphenhydrAMINE-zinc acetate (BENADRYL EXTRA STRENGTH) cream Apply 1 application topically 3 (three) times daily as needed for itching. 28.4 g 0   famotidine (PEPCID) 20 MG tablet Take 1 tablet (20 mg total) by mouth 2 (two) times daily. 60 tablet 0   ferrous sulfate 325 (65 FE)  MG tablet Take 325 mg by mouth daily with breakfast.     fluticasone (FLONASE) 50 MCG/ACT nasal spray Place 2 sprays into both nostrils daily. 16 g 2   methocarbamol (ROBAXIN) 500 MG tablet Take 1 tablet (500 mg total) by mouth 2 (two) times daily. 20 tablet 0   naproxen (NAPROSYN) 500 MG tablet Take 1 tablet (500 mg total) by mouth 2 (two) times daily. 30 tablet 0   pantoprazole (PROTONIX) 20 MG tablet Take 1 tablet (20 mg total) by mouth daily for 14 days. 14 tablet 0   triamcinolone cream (KENALOG) 0.1 % Apply 1 application topically 2 (two) times daily. 30 g 0   No current facility-administered medications for this visit.     PHYSICAL EXAMINATION: ECOG PERFORMANCE STATUS: {CHL ONC ECOG PS:[phone]}  There were no vitals filed for this visit. There were no vitals filed for this visit.  GENERAL:alert, no distress and comfortable SKIN: skin color, texture, turgor are normal, no rashes or significant lesions EYES: normal, conjunctiva are pink and non-injected, sclera clear OROPHARYNX:no exudate, no erythema and lips, buccal mucosa, and tongue normal  NECK: supple, thyroid normal size, non-tender, without nodularity LYMPH:  no palpable lymphadenopathy in the cervical, axillary or inguinal LUNGS: clear to auscultation and percussion with normal breathing effort HEART: regular rate & rhythm and no murmurs and no lower extremity edema ABDOMEN:abdomen soft, non-tender and normal bowel sounds Musculoskeletal:no cyanosis of digits and no clubbing  PSYCH: alert & oriented x 3 with fluent speech NEURO: no focal motor/sensory deficits  LABORATORY DATA:  I have reviewed the data as listed Lab Results  Component Value Date   WBC 4.6 02/20/2023   HGB 8.4 (L) 02/20/2023   HCT 28.5 (L) 02/20/2023   MCV 70.0 (L) 02/20/2023   PLT 392 02/20/2023     Chemistry      Component Value Date/Time   NA 135 02/20/2023 0813   K 4.0 02/20/2023 0813   CL 104 02/20/2023 0813   CO2 21 (L) 02/20/2023  0813   BUN 11 02/20/2023 0813   CREATININE 0.94 02/20/2023 0813      Component Value Date/Time   CALCIUM 8.9 02/20/2023 0813   ALKPHOS 77 02/20/2023 0813   AST 24 02/20/2023 0813   ALT 21 02/20/2023 0813   BILITOT 0.4 02/20/2023 0813      Pertinent labs reviewed. Hb 8.8, microcytic hypochromic and thrombocytosis.  RADIOGRAPHIC STUDIES: I have personally reviewed the radiological images as listed and agreed with the findings in the report. No results found.  All questions were answered. The patient knows to call the clinic with any problems, questions or concerns. I spent *** minutes in the care of this patient including H and P, review of records, counseling and coordination of care.     Rachel Moulds, MD 06/24/2023 5:54  PM

## 2023-06-24 NOTE — Telephone Encounter (Signed)
 Spoke with patient and confirmed appointment on 3/28

## 2023-06-25 ENCOUNTER — Inpatient Hospital Stay

## 2023-06-25 ENCOUNTER — Inpatient Hospital Stay: Admitting: Hematology and Oncology

## 2023-07-29 ENCOUNTER — Other Ambulatory Visit: Payer: Self-pay

## 2023-07-29 ENCOUNTER — Emergency Department (HOSPITAL_BASED_OUTPATIENT_CLINIC_OR_DEPARTMENT_OTHER)
Admission: EM | Admit: 2023-07-29 | Discharge: 2023-07-29 | Disposition: A | Discharge status: Home / Self Care | Attending: Emergency Medicine | Admitting: Emergency Medicine

## 2023-07-29 ENCOUNTER — Encounter (HOSPITAL_BASED_OUTPATIENT_CLINIC_OR_DEPARTMENT_OTHER): Payer: Self-pay | Admitting: *Deleted

## 2023-07-29 DIAGNOSIS — Z91010 Allergy to peanuts: Secondary | ICD-10-CM | POA: Diagnosis not present

## 2023-07-29 DIAGNOSIS — J45909 Unspecified asthma, uncomplicated: Secondary | ICD-10-CM | POA: Diagnosis not present

## 2023-07-29 DIAGNOSIS — Z7951 Long term (current) use of inhaled steroids: Secondary | ICD-10-CM | POA: Insufficient documentation

## 2023-07-29 DIAGNOSIS — J45901 Unspecified asthma with (acute) exacerbation: Secondary | ICD-10-CM

## 2023-07-29 DIAGNOSIS — R0602 Shortness of breath: Secondary | ICD-10-CM | POA: Diagnosis present

## 2023-07-29 MED ORDER — PREDNISONE 20 MG PO TABS: 40.0000 mg | ORAL_TABLET | Freq: Every day | ORAL | 0 refills | Status: AC

## 2023-07-29 MED ORDER — IPRATROPIUM-ALBUTEROL 0.5-2.5 (3) MG/3ML IN SOLN
3.0000 mL | Freq: Once | RESPIRATORY_TRACT | Status: AC
  Administered 2023-07-29: 3 mL via RESPIRATORY_TRACT
  Filled 2023-07-29: qty 3

## 2023-07-29 MED ORDER — ALBUTEROL SULFATE HFA 108 (90 BASE) MCG/ACT IN AERS
2.0000 | INHALATION_SPRAY | RESPIRATORY_TRACT | Status: DC | PRN
  Administered 2023-07-29: 2 via RESPIRATORY_TRACT
  Filled 2023-07-29: qty 6.7

## 2023-07-29 NOTE — ED Notes (Signed)
 Pt alert and oriented X 4 at the time of discharge. RR even and unlabored. No acute distress noted. Pt verbalized understanding of discharge instructions as discussed. Pt ambulatory to lobby at time of discharge.

## 2023-07-29 NOTE — ED Provider Notes (Signed)
 [facility] Provider Note   CSN: [id number] Arrival date & time: 07/29/23  1046     History Asthma, eczema Chief Complaint  Patient presents with   Asthma    Darlene Logan is a 48 y.o. female.  Pt notes SOB, dry cough, and wheezing started last night and she had run out of her albuterol  inhaler.  She does not currently use a maintenance inhaler stating it was too expensive.  She denies any sick symptoms including fever, body ache, diarrhea, vomiting, nausea or fatigue. No light headedness, chest pain or leg swelling.    Asthma Pertinent negatives include no chest pain.       Home Medications Prior to Admission medications   Medication Sig Start Date End Date Taking? Authorizing Provider  predniSONE  (DELTASONE ) 20 MG tablet Take 2 tablets (40 mg total) by mouth daily with breakfast for 5 days. 07/29/23 08/03/23 Yes Glenn Lange, DO  ADVAIR DISKUS 250-50 MCG/DOSE AEPB Inhale 1 puff into the lungs 2 (two) times daily. 06/11/20   [provider]  albuterol  (VENTOLIN  HFA) 108 (90 Base) MCG/ACT inhaler Inhale 1-2 puffs into the lungs every 4 (four) hours as needed for wheezing or shortness of breath. 10/11/22   Teddi Favors, DO  cetirizine  (ZYRTEC  ALLERGY) 10 MG tablet Take 1 tablet (10 mg total) by mouth daily. 08/10/19   Jerri Morale A, FNP  ciprofloxacin (CIPRO) 500 MG tablet Take 1 tablet by mouth every 12 (twelve) hours. 07/09/20   [provider]  dicyclomine (BENTYL) 20 MG tablet Take 20 mg by mouth 4 (four) times daily. 07/09/20   [provider]  diphenhydrAMINE -zinc  acetate (BENADRYL  EXTRA STRENGTH) cream Apply 1 application topically 3 (three) times daily as needed for itching. 04/24/21   Dalene Duck, MD  famotidine  (PEPCID ) 20 MG tablet Take 1 tablet (20 mg total) by mouth 2 (two) times daily. 06/20/20   Adolph Hoop, PA-C  ferrous sulfate 325 (65 FE) MG tablet Take 325 mg by mouth daily with breakfast.     [provider]  fluticasone  (FLONASE ) 50 MCG/ACT nasal spray Place 2 sprays into both nostrils daily. 12/07/18   Joy, Shawn C, PA-C  methocarbamol  (ROBAXIN ) 500 MG tablet Take 1 tablet (500 mg total) by mouth 2 (two) times daily. 08/13/20   Everlyn Hockey, PA-C  naproxen  (NAPROSYN ) 500 MG tablet Take 1 tablet (500 mg total) by mouth 2 (two) times daily. 08/13/20   Everlyn Hockey, PA-C  pantoprazole  (PROTONIX ) 20 MG tablet Take 1 tablet (20 mg total) by mouth daily for 14 days. 10/11/22 10/25/22  Russella Courts A, DO  triamcinolone  cream (KENALOG ) 0.1 % Apply 1 application topically 2 (two) times daily. 12/06/20   Khatri, Hina, PA-C      Allergies    Other, Loratadine, Peanut-containing drug products, Shellfish allergy, Egg-derived products, Pecan nut (diagnostic), Coconut (cocos nucifera), Coconut fatty acid, and Egg white (egg protein)    Review of Systems   Review of Systems  Constitutional:  Negative for activity change, fatigue and fever.  HENT:  Negative for congestion and rhinorrhea.   Respiratory:  Positive for cough.   Cardiovascular:  Negative for chest pain and leg swelling.  Gastrointestinal:  Negative for diarrhea, nausea and vomiting.  Musculoskeletal:  Negative for arthralgias.  Neurological:  Negative for light-headedness.    Physical Exam Updated Vital Signs BP 137/79   Pulse 88   Temp 97.9 F (36.6 C) (Oral)   Resp 16  SpO2 100%  Physical Exam Constitutional:      General: She is not in acute distress.    Appearance: She is not ill-appearing or diaphoretic.  HENT:     Mouth/Throat:     Mouth: Mucous membranes are moist.     Pharynx: Oropharynx is clear. No oropharyngeal exudate or posterior oropharyngeal erythema.  Eyes:     Conjunctiva/sclera: Conjunctivae normal.  Cardiovascular:     Rate and Rhythm: Normal rate and regular rhythm.  Pulmonary:     Effort: Pulmonary effort is normal. No respiratory distress.     Breath sounds: No stridor. Wheezing  present. No rhonchi or rales.     Comments: Inspiratory and expiratory wheezing with mildly decreased breath sounds throughout Abdominal:     General: Abdomen is flat. There is no distension.     Palpations: Abdomen is soft.     Tenderness: There is no abdominal tenderness.  Skin:    General: Skin is warm and dry.  Neurological:     General: No focal deficit present.     Mental Status: She is alert.  Psychiatric:        Mood and Affect: Mood normal.        Behavior: Behavior normal.     ED Results / Procedures / Treatments   Labs (all labs ordered are listed, but only abnormal results are displayed) Labs Reviewed - No data to display  EKG EKG Interpretation Date/Time:  Thursday Jul 29 2023 10:58:31 EDT Ventricular Rate:  84 PR Interval:  148 QRS Duration:  84 QT Interval:  371 QTC Calculation: 439 R Axis:   63  Text Interpretation: Sinus rhythm Confirmed by Abby Hocking [id number]) on 07/29/2023 11:01:23 AM  Radiology No results found.  Procedures Procedures  None  Medications Ordered in ED Medications  albuterol  (VENTOLIN  HFA) 108 (90 Base) MCG/ACT inhaler 2 puff (has no administration in time range)  ipratropium-albuterol  (DUONEB) 0.5-2.5 (3) MG/3ML nebulizer solution 3 mL (3 mLs Nebulization Given 07/29/23 1116)    ED Course/ Medical Decision Making/ A&P    Medical Decision Making 48 year old female known history of asthma presents with dry cough, wheezing and shortness of breath since last night and had run out of her albuterol  inhaler.  Not using a maintenance inhaler due to financial issues. No other sick symptoms suggestive of respiratory infection.  No exam findings or symptoms concerning for PE.   Patient was treated with DuoNeb x 1 with significant improvement in shortness of breath.  On auscultation after treatment, she had better air movement and only a very mild expiratory wheeze and continued to have normal work of breathing with SpO2 100%.  She did not  require admission.  She was discharged in the ED in stable condition with albuterol  inhaler and prescription for a 5-day course of prednisone .  She was advised to follow-up with her PCP today to discuss starting an affordable maintenance inhaler if possible.  Risk Prescription drug management.      Final Clinical Impression(s) / ED Diagnoses Final diagnoses:  None    Rx / DC Orders ED Discharge Orders          Ordered    predniSONE  (DELTASONE ) 20 MG tablet  Daily with breakfast        07/29/23 1150           Glenn Lange, DO Family medicine, PGY-3   Glenn Lange, DO 07/29/23 1157    Long, Shereen Dike, MD 07/30/23 [phone]

## 2023-07-29 NOTE — ED Notes (Signed)
   07/29/23 1117  Aerosol Therapy Tx  Mid-Treatment Pulse 83  Post-Treatment Pulse 88  Post-Treatment Respirations 16  Treatment Tolerance Tolerated well  Treatment Given 1  RT Breath Sounds  Bilateral Breath Sounds Diminished;Clear  Oxygen Therapy/Pulse Ox  O2 Therapy Room air  SpO2 100 %   Patient with no distress on phone t/o breathing treatment.

## 2023-07-29 NOTE — ED Triage Notes (Signed)
 Pt has been having an asthma exacerbation which began last pm.  Pt is now out of albuerol inhaler.  Pt reports sob but is fortunately able to speak in full sentences and able to walk back to room

## 2023-07-29 NOTE — ED Notes (Signed)
   07/29/23 1101  Respiratory Assessment  $ RT Protocol Assessment  Yes  Assessment Type Pre-treatment  Respiratory Pattern Unlabored;Symmetrical  Chest Assessment Chest expansion symmetrical  Cough Non-productive  Bilateral Breath Sounds Expiratory wheezes;Diminished  Oxygen Therapy/Pulse Ox  O2 Therapy Room air  SpO2 100 %   No Resp distress noted, speaking in completed sentences, states ran out of her inhaler, last used ~10:30am.  RT to monitor for additional needs.

## 2023-07-29 NOTE — Discharge Instructions (Addendum)
 An oral steroid called prednisone  has been sent to your pharmacy.  Start taking this today, 2 tablets daily for the next 5 days. Continue to use the albuterol  inhaler as needed.  I recommend following up with your PCP to discuss getting an affordable maintenance inhaler.  Sometimes return please come back to the ED or go see your PCP

## 2023-12-29 ENCOUNTER — Emergency Department (HOSPITAL_BASED_OUTPATIENT_CLINIC_OR_DEPARTMENT_OTHER)
Admission: EM | Admit: 2023-12-29 | Discharge: 2023-12-29 | Disposition: A | Discharge status: Home / Self Care | Payer: Self-pay

## 2023-12-29 ENCOUNTER — Encounter (HOSPITAL_BASED_OUTPATIENT_CLINIC_OR_DEPARTMENT_OTHER): Payer: Self-pay

## 2023-12-29 ENCOUNTER — Other Ambulatory Visit: Payer: Self-pay

## 2023-12-29 DIAGNOSIS — Z7951 Long term (current) use of inhaled steroids: Secondary | ICD-10-CM | POA: Insufficient documentation

## 2023-12-29 DIAGNOSIS — Z91010 Allergy to peanuts: Secondary | ICD-10-CM | POA: Insufficient documentation

## 2023-12-29 DIAGNOSIS — Z7952 Long term (current) use of systemic steroids: Secondary | ICD-10-CM | POA: Insufficient documentation

## 2023-12-29 DIAGNOSIS — J45901 Unspecified asthma with (acute) exacerbation: Secondary | ICD-10-CM | POA: Insufficient documentation

## 2023-12-29 MED ORDER — METHYLPREDNISOLONE SODIUM SUCC 125 MG IJ SOLR
125.0000 mg | Freq: Once | INTRAMUSCULAR | Status: AC
  Administered 2023-12-29: 125 mg via INTRAMUSCULAR
  Filled 2023-12-29: qty 2

## 2023-12-29 MED ORDER — IPRATROPIUM-ALBUTEROL 0.5-2.5 (3) MG/3ML IN SOLN
3.0000 mL | Freq: Once | RESPIRATORY_TRACT | Status: AC
  Administered 2023-12-29: 3 mL via RESPIRATORY_TRACT
  Filled 2023-12-29: qty 3

## 2023-12-29 MED ORDER — ALBUTEROL SULFATE HFA 108 (90 BASE) MCG/ACT IN AERS
1.0000 | INHALATION_SPRAY | Freq: Once | RESPIRATORY_TRACT | Status: AC
  Administered 2023-12-29: 2 via RESPIRATORY_TRACT
  Filled 2023-12-29: qty 6.7

## 2023-12-29 MED ORDER — PREDNISONE 10 MG PO TABS: 40.0000 mg | ORAL_TABLET | Freq: Every day | ORAL | 0 refills | Status: AC

## 2023-12-29 MED ORDER — ALBUTEROL SULFATE HFA 108 (90 BASE) MCG/ACT IN AERS: 1.0000 | INHALATION_SPRAY | Freq: Four times a day (QID) | RESPIRATORY_TRACT | 0 refills | Status: AC | PRN

## 2023-12-29 NOTE — Discharge Instructions (Signed)
 Use your inhaler every 4-6 hours as needed for shortness of breath and wheezing.  Take your steroids as prescribed.  You can use any of the over-the-counter medications as needed for your symptoms.

## 2023-12-29 NOTE — ED Triage Notes (Signed)
 Pt presents with SOB that has been going on for a week. Pt ran out of her inhaler maybe a couple of days ago. Denies other symptoms. She doesn't feel tight, just gets winded really fast.

## 2023-12-29 NOTE — ED Provider Notes (Signed)
 [place] [facility] AT [facility] Provider Note   CSN: [id number] Arrival date & time: 12/29/23  9250     Patient presents with: Shortness of Breath   Darlene Logan is a 48 y.o. female.   48 year old female presents for evaluation of asthma and shortness of breath.  States she gets winded very easily.  States she ran out of her inhaler about a week and a half ago.  She has had frequent dry cough and has noticed more wheezing.  Denies any other symptoms or concerns at this time.   Shortness of Breath Associated symptoms: no abdominal pain, no chest pain, no cough, no ear pain, no fever, no rash, no sore throat and no vomiting        Prior to Admission medications   Medication Sig Start Date End Date Taking? Authorizing Provider  albuterol  (VENTOLIN  HFA) 108 (90 Base) MCG/ACT inhaler Inhale 1-2 puffs into the lungs every 6 (six) hours as needed for wheezing or shortness of breath. 12/29/23  Yes Tayveon Lombardo L, DO  predniSONE  (DELTASONE ) 10 MG tablet Take 4 tablets (40 mg total) by mouth daily for 4 days. 12/29/23 01/02/24 Yes Giavonni Cizek L, DO  ADVAIR DISKUS 250-50 MCG/DOSE AEPB Inhale 1 puff into the lungs 2 (two) times daily. 06/11/20   [provider]  albuterol  (VENTOLIN  HFA) 108 (90 Base) MCG/ACT inhaler Inhale 1-2 puffs into the lungs every 4 (four) hours as needed for wheezing or shortness of breath. 10/11/22   Elnor Jayson LABOR, DO  cetirizine  (ZYRTEC  ALLERGY) 10 MG tablet Take 1 tablet (10 mg total) by mouth daily. 08/10/19   Adah Corning A, FNP  ciprofloxacin (CIPRO) 500 MG tablet Take 1 tablet by mouth every 12 (twelve) hours. 07/09/20   [provider]  dicyclomine (BENTYL) 20 MG tablet Take 20 mg by mouth 4 (four) times daily. 07/09/20   [provider]  diphenhydrAMINE -zinc  acetate (BENADRYL  EXTRA STRENGTH) cream Apply 1 application topically 3 (three) times daily as needed for itching. 04/24/21   Patt Alm Macho, MD   famotidine  (PEPCID ) 20 MG tablet Take 1 tablet (20 mg total) by mouth 2 (two) times daily. 06/20/20   Christopher Savannah, PA-C  ferrous sulfate 325 (65 FE) MG tablet Take 325 mg by mouth daily with breakfast.    [provider]  fluticasone  (FLONASE ) 50 MCG/ACT nasal spray Place 2 sprays into both nostrils daily. 12/07/18   Joy, Shawn C, PA-C  methocarbamol  (ROBAXIN ) 500 MG tablet Take 1 tablet (500 mg total) by mouth 2 (two) times daily. 08/13/20   Kehrli, Kelsey F, PA-C  naproxen  (NAPROSYN ) 500 MG tablet Take 1 tablet (500 mg total) by mouth 2 (two) times daily. 08/13/20   Alva Larraine FALCON, PA-C  pantoprazole  (PROTONIX ) 20 MG tablet Take 1 tablet (20 mg total) by mouth daily for 14 days. 10/11/22 10/25/22  Elnor Jayson LABOR, DO  triamcinolone  cream (KENALOG ) 0.1 % Apply 1 application topically 2 (two) times daily. 12/06/20   Khatri, Hina, PA-C    Allergies: Other, Loratadine, Peanut-containing drug products, Shellfish allergy, Egg-derived products, Pecan nut (diagnostic), Coconut (cocos nucifera), Coconut fatty acid, and Egg white (egg protein)    Review of Systems  Constitutional:  Negative for chills and fever.  HENT:  Negative for ear pain and sore throat.   Eyes:  Negative for pain and visual disturbance.  Respiratory:  Positive for shortness of breath. Negative for cough.   Cardiovascular:  Negative for chest pain and palpitations.  Gastrointestinal:  Negative for abdominal pain and vomiting.  Genitourinary:  Negative for dysuria and hematuria.  Musculoskeletal:  Negative for arthralgias and back pain.  Skin:  Negative for color change and rash.  Neurological:  Negative for seizures and syncope.  All other systems reviewed and are negative.   Updated Vital Signs BP 116/70 (BP Location: Left Arm)   Pulse 92   Temp 98.1 F (36.7 C) (Oral)   Resp 20   Ht 5' 6 (1.676 m)   Wt 104.3 kg   LMP 12/15/2023 (Approximate)   SpO2 100%   BMI 37.12 kg/m   Physical Exam Vitals and nursing note  reviewed.  Constitutional:      General: She is not in acute distress.    Appearance: She is well-developed. She is not ill-appearing.  HENT:     Head: Normocephalic and atraumatic.  Eyes:     Conjunctiva/sclera: Conjunctivae normal.  Cardiovascular:     Rate and Rhythm: Normal rate and regular rhythm.     Heart sounds: No murmur heard. Pulmonary:     Effort: Pulmonary effort is normal. No respiratory distress.     Breath sounds: Examination of the right-upper field reveals wheezing. Examination of the left-upper field reveals wheezing. Examination of the right-middle field reveals wheezing. Examination of the left-middle field reveals wheezing. Wheezing present.  Abdominal:     Palpations: Abdomen is soft.     Tenderness: There is no abdominal tenderness.  Musculoskeletal:        General: No swelling.     Cervical back: Neck supple.  Skin:    General: Skin is warm and dry.     Capillary Refill: Capillary refill takes less than 2 seconds.  Neurological:     Mental Status: She is alert.  Psychiatric:        Mood and Affect: Mood normal.     (all labs ordered are listed, but only abnormal results are displayed) Labs Reviewed - No data to display  EKG: None  Radiology: No results found.   Procedures   Medications Ordered in the ED  ipratropium-albuterol  (DUONEB) 0.5-2.5 (3) MG/3ML nebulizer solution 3 mL (3 mLs Nebulization Given 12/29/23 0813)  methylPREDNISolone  sodium succinate (SOLU-MEDROL ) 125 mg/2 mL injection 125 mg (125 mg Intramuscular Given 12/29/23 0825)  albuterol  (VENTOLIN  HFA) 108 (90 Base) MCG/ACT inhaler 1-2 puff (2 puffs Inhalation Given 12/29/23 0856)                                    Medical Decision Making Patient here for asthma exacerbation.  Vitals are stable and she does have some mild wheezing.  Will give her breathing treatment and steroids here.  On reevaluation she is feeling much better.  Will refill her albuterol  inhaler, give her 1 to go  home with and give her prescription for steroids.  Advise follow-up primary care and otherwise return to the ER for new or worsening symptoms.  She feels comfortable being discharged home  Problems Addressed: Moderate asthma with exacerbation, unspecified whether persistent: acute illness or injury  Amount and/or Complexity of Data Reviewed External Data Reviewed: notes.    Details: Prior ED records reviewed and patient seen frequently for asthma exacerbation  Risk OTC drugs. Prescription drug management.     Final diagnoses:  Moderate asthma with exacerbation, unspecified whether persistent    ED Discharge Orders          Ordered  albuterol  (VENTOLIN  HFA) 108 (90 Base) MCG/ACT inhaler  Every 6 hours PRN        12/29/23 0842    predniSONE  (DELTASONE ) 10 MG tablet  Daily        12/29/23 0842               Gennaro Duwaine CROME, DO 12/29/23 1449

## 2024-02-05 ENCOUNTER — Other Ambulatory Visit: Payer: Self-pay

## 2024-02-05 ENCOUNTER — Emergency Department (HOSPITAL_COMMUNITY)
Admission: EM | Admit: 2024-02-05 | Discharge: 2024-02-05 | Disposition: A | Discharge status: Home / Self Care | Payer: Self-pay | Attending: Emergency Medicine | Admitting: Emergency Medicine

## 2024-02-05 ENCOUNTER — Encounter (HOSPITAL_COMMUNITY): Payer: Self-pay

## 2024-02-05 DIAGNOSIS — J4541 Moderate persistent asthma with (acute) exacerbation: Secondary | ICD-10-CM | POA: Insufficient documentation

## 2024-02-05 DIAGNOSIS — Z91010 Allergy to peanuts: Secondary | ICD-10-CM | POA: Insufficient documentation

## 2024-02-05 MED ORDER — PREDNISONE 50 MG PO TABS: ORAL_TABLET | ORAL | 0 refills | Status: AC

## 2024-02-05 MED ORDER — BUDESONIDE-FORMOTEROL FUMARATE 80-4.5 MCG/ACT IN AERO: 2.0000 | INHALATION_SPRAY | Freq: Every day | RESPIRATORY_TRACT | 12 refills | Status: AC

## 2024-02-05 MED ORDER — ALBUTEROL SULFATE (2.5 MG/3ML) 0.083% IN NEBU
10.0000 mg/h | INHALATION_SOLUTION | Freq: Once | RESPIRATORY_TRACT | Status: AC
  Administered 2024-02-05: 10 mg/h via RESPIRATORY_TRACT
  Filled 2024-02-05: qty 12

## 2024-02-05 NOTE — ED Notes (Signed)
 Pt axox4. GCS 15. Pt verbalizes understanding of discharge instructions, follow up and Rx. Pt ambulated out of er with steady gait to transportation home with family
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
## 2024-04-27 ENCOUNTER — Encounter (HOSPITAL_BASED_OUTPATIENT_CLINIC_OR_DEPARTMENT_OTHER): Payer: Self-pay

## 2024-04-27 ENCOUNTER — Other Ambulatory Visit (HOSPITAL_BASED_OUTPATIENT_CLINIC_OR_DEPARTMENT_OTHER): Payer: Self-pay

## 2024-04-27 ENCOUNTER — Emergency Department (HOSPITAL_BASED_OUTPATIENT_CLINIC_OR_DEPARTMENT_OTHER)
Admission: EM | Admit: 2024-04-27 | Discharge: 2024-04-27 | Disposition: A | Discharge status: Home / Self Care | Payer: Self-pay | Attending: Emergency Medicine | Admitting: Emergency Medicine

## 2024-04-27 ENCOUNTER — Other Ambulatory Visit: Payer: Self-pay

## 2024-04-27 DIAGNOSIS — L309 Dermatitis, unspecified: Secondary | ICD-10-CM | POA: Insufficient documentation

## 2024-04-27 DIAGNOSIS — Z91010 Allergy to peanuts: Secondary | ICD-10-CM | POA: Insufficient documentation

## 2024-04-27 DIAGNOSIS — R21 Rash and other nonspecific skin eruption: Secondary | ICD-10-CM

## 2024-04-27 MED ORDER — METHYLPREDNISOLONE 4 MG PO TBPK
ORAL_TABLET | ORAL | 0 refills | Status: AC
  Filled 2024-04-27: qty 21, 6d supply, fill #0

## 2024-04-27 NOTE — ED Provider Notes (Signed)
 " [place] [facility] AT [facility] Provider Note   CSN: [id number] Arrival date & time: 04/27/24  9170     Patient presents with: Rash   Darlene Logan is a 49 y.o. female.   Patient here with rash and itchiness to her chest neck arms history of eczema.  Thought maybe started after eating some chicken with sauce but also seems very similar to her eczema issues in the past.  She has been taking Benadryl  and triamcinolone  cream.  She denies any weakness numbness tingling.  Nothing makes it worse or better.  Denies any lip or tongue swelling.  Denies any difficulty breathing.  The history is provided by the patient.       Prior to Admission medications  Medication Sig Start Date End Date Taking? Authorizing Provider  methylPREDNISolone  (MEDROL  DOSEPAK) 4 MG TBPK tablet Follow package insert 04/27/24  Yes Warnie Belair, DO  albuterol  (VENTOLIN  HFA) 108 (90 Base) MCG/ACT inhaler Inhale 1-2 puffs into the lungs every 6 (six) hours as needed for wheezing or shortness of breath. 12/29/23   Kammerer, Megan L, DO  budesonide -formoterol  (SYMBICORT ) 80-4.5 MCG/ACT inhaler Inhale 2 puffs into the lungs daily. 02/05/24   Midge Golas, MD  cetirizine  (ZYRTEC  ALLERGY) 10 MG tablet Take 1 tablet (10 mg total) by mouth daily. 08/10/19   Adah Wilbert LABOR, FNP  ferrous sulfate 325 (65 FE) MG tablet Take 325 mg by mouth daily with breakfast.    [provider]  predniSONE  (DELTASONE ) 50 MG tablet 1 tablet PO QD X4 days 02/05/24   Midge Golas, MD  triamcinolone  cream (KENALOG ) 0.1 % Apply 1 application topically 2 (two) times daily. 12/06/20   Khatri, Hina, PA-C    Allergies: Other, Loratadine, Peanut-containing drug products, Shellfish allergy, Egg protein-containing drug products, Pecan nut (diagnostic), Coconut (cocos nucifera), Coconut fatty acid, and Egg protein (egg white)    Review of Systems  Updated Vital Signs BP (!) 160/98   Pulse (!) 107   Temp 98.1 F  (36.7 C)   Resp 18   Wt 104.3 kg   LMP  (LMP Unknown) Comment: December  SpO2 99%   BMI 37.12 kg/m   Physical Exam Vitals and nursing note reviewed.  Constitutional:      General: She is not in acute distress.    Appearance: She is well-developed.  HENT:     Head: Normocephalic and atraumatic.  Eyes:     Conjunctiva/sclera: Conjunctivae normal.  Cardiovascular:     Rate and Rhythm: Normal rate and regular rhythm.     Heart sounds: No murmur heard. Pulmonary:     Effort: Pulmonary effort is normal. No respiratory distress.     Breath sounds: Normal breath sounds.  Abdominal:     Palpations: Abdomen is soft.     Tenderness: There is no abdominal tenderness.  Musculoskeletal:        General: No swelling.     Cervical back: Neck supple.  Skin:    General: Skin is warm and dry.     Capillary Refill: Capillary refill takes less than 2 seconds.     Findings: Rash present.     Comments: Eczema type rash to arms chest  Neurological:     Mental Status: She is alert.  Psychiatric:        Mood and Affect: Mood normal.     (all labs ordered are listed, but only abnormal results are displayed) Labs Reviewed - No data to display  EKG: None  Radiology: No results found.   Procedures   Medications Ordered in the ED - No data to display                                  Medical Decision Making Risk Prescription drug management.   Darlene Logan is here with rash.  Looks like an eczema type rash on her chest and arms.  Have no concern for anaphylaxis or any other acute process.  Will put her on steroid Dosepak.  She has had success with this in the past.  She got normal vitals.  No fever.  Well-appearing.  Follow-up with primary care.  Return if symptoms worsen.  Discharge.  This chart was dictated using voice recognition software.  Despite best efforts to proofread,  errors can occur which can change the documentation meaning.      Final diagnoses:  Rash  Eczema,  unspecified type    ED Discharge Orders          Ordered    methylPREDNISolone  (MEDROL  DOSEPAK) 4 MG TBPK tablet        04/27/24 0840               Ruthe Cornet, DO 04/27/24 0931  "

## 2024-04-27 NOTE — ED Triage Notes (Signed)
 Pt reports rash on face, neck and arms for 2 days. Ate chicken with sauce last night which made it worse. Endorses itching. Pt using cortisone cream andt aking beandryl

## 2024-04-27 NOTE — Discharge Instructions (Signed)
 Take steroids as prescribed.  Continue Benadryl  as needed for itching.  Return if symptoms worsen or follow-up with your primary care doctor.
